# Patient Record
Sex: Female | Born: 1960 | Race: White | Hispanic: No | Marital: Married | Smoking: Never smoker
Health system: Southern US, Community
[De-identification: ages and names within clinical notes are randomized; demographics above are authoritative.]

## PROBLEM LIST (undated history)

## (undated) DIAGNOSIS — F419 Anxiety disorder, unspecified: Secondary | ICD-10-CM

## (undated) DIAGNOSIS — Z87898 Personal history of other specified conditions: Secondary | ICD-10-CM

## (undated) DIAGNOSIS — R1032 Left lower quadrant pain: Secondary | ICD-10-CM

## (undated) DIAGNOSIS — N840 Polyp of corpus uteri: Secondary | ICD-10-CM

## (undated) DIAGNOSIS — IMO0002 Reserved for concepts with insufficient information to code with codable children: Secondary | ICD-10-CM

## (undated) DIAGNOSIS — N289 Disorder of kidney and ureter, unspecified: Secondary | ICD-10-CM

## (undated) DIAGNOSIS — K649 Unspecified hemorrhoids: Secondary | ICD-10-CM

## (undated) DIAGNOSIS — R87619 Unspecified abnormal cytological findings in specimens from cervix uteri: Secondary | ICD-10-CM

## (undated) DIAGNOSIS — Z8619 Personal history of other infectious and parasitic diseases: Secondary | ICD-10-CM

## (undated) DIAGNOSIS — K219 Gastro-esophageal reflux disease without esophagitis: Secondary | ICD-10-CM

## (undated) DIAGNOSIS — D219 Benign neoplasm of connective and other soft tissue, unspecified: Secondary | ICD-10-CM

## (undated) DIAGNOSIS — I10 Essential (primary) hypertension: Secondary | ICD-10-CM

## (undated) DIAGNOSIS — N83209 Unspecified ovarian cyst, unspecified side: Secondary | ICD-10-CM

## (undated) DIAGNOSIS — K589 Irritable bowel syndrome without diarrhea: Secondary | ICD-10-CM

## (undated) HISTORY — PX: WISDOM TOOTH EXTRACTION: SHX21

## (undated) HISTORY — DX: Personal history of other specified conditions: Z87.898

## (undated) HISTORY — PX: TUBAL LIGATION: SHX77

## (undated) HISTORY — DX: Left lower quadrant pain: R10.32

## (undated) HISTORY — DX: Reserved for concepts with insufficient information to code with codable children: IMO0002

## (undated) HISTORY — DX: Essential (primary) hypertension: I10

## (undated) HISTORY — DX: Irritable bowel syndrome, unspecified: K58.9

## (undated) HISTORY — PX: INNER EAR SURGERY: SHX679

## (undated) HISTORY — PX: CHOLECYSTECTOMY: SHX55

## (undated) HISTORY — DX: Unspecified abnormal cytological findings in specimens from cervix uteri: R87.619

## (undated) HISTORY — DX: Anxiety disorder, unspecified: F41.9

## (undated) HISTORY — DX: Polyp of corpus uteri: N84.0

## (undated) HISTORY — DX: Disorder of kidney and ureter, unspecified: N28.9

## (undated) HISTORY — PX: CARDIOVASCULAR SURGERY: SHX460

## (undated) HISTORY — DX: Gastro-esophageal reflux disease without esophagitis: K21.9

## (undated) HISTORY — DX: Personal history of other infectious and parasitic diseases: Z86.19

## (undated) HISTORY — PX: TOE AMPUTATION: SHX809

## (undated) HISTORY — DX: Unspecified ovarian cyst, unspecified side: N83.209

## (undated) HISTORY — DX: Benign neoplasm of connective and other soft tissue, unspecified: D21.9

## (undated) HISTORY — DX: Unspecified hemorrhoids: K64.9

---

## 2000-02-13 ENCOUNTER — Ambulatory Visit (HOSPITAL_COMMUNITY): Admission: RE | Admit: 2000-02-13 | Discharge: 2000-02-13 | Payer: Self-pay

## 2000-03-13 ENCOUNTER — Other Ambulatory Visit: Admission: RE | Admit: 2000-03-13 | Discharge: 2000-03-13 | Payer: Self-pay | Admitting: *Deleted

## 2001-04-08 ENCOUNTER — Encounter: Admission: RE | Admit: 2001-04-08 | Discharge: 2001-04-08 | Payer: Self-pay

## 2004-06-15 ENCOUNTER — Encounter: Admission: RE | Admit: 2004-06-15 | Discharge: 2004-06-15 | Payer: Self-pay

## 2004-08-03 ENCOUNTER — Encounter: Admission: RE | Admit: 2004-08-03 | Discharge: 2004-10-10 | Payer: Self-pay | Admitting: Orthopedic Surgery

## 2005-01-22 DIAGNOSIS — N840 Polyp of corpus uteri: Secondary | ICD-10-CM

## 2005-01-22 HISTORY — DX: Polyp of corpus uteri: N84.0

## 2005-09-27 ENCOUNTER — Ambulatory Visit: Payer: Self-pay | Admitting: Gastroenterology

## 2005-10-01 ENCOUNTER — Ambulatory Visit: Payer: Self-pay | Admitting: Gastroenterology

## 2005-10-04 ENCOUNTER — Encounter: Admission: RE | Admit: 2005-10-04 | Discharge: 2005-10-04 | Payer: Self-pay | Admitting: *Deleted

## 2005-10-04 ENCOUNTER — Other Ambulatory Visit: Admission: RE | Admit: 2005-10-04 | Discharge: 2005-10-04 | Payer: Self-pay | Admitting: Obstetrics and Gynecology

## 2005-10-09 ENCOUNTER — Ambulatory Visit: Payer: Self-pay | Admitting: Gastroenterology

## 2005-11-12 ENCOUNTER — Ambulatory Visit: Payer: Self-pay | Admitting: Gastroenterology

## 2005-11-27 ENCOUNTER — Ambulatory Visit (HOSPITAL_COMMUNITY): Admission: RE | Admit: 2005-11-27 | Discharge: 2005-11-27 | Payer: Self-pay | Admitting: Obstetrics and Gynecology

## 2006-10-18 DIAGNOSIS — R1032 Left lower quadrant pain: Secondary | ICD-10-CM

## 2006-10-18 DIAGNOSIS — K649 Unspecified hemorrhoids: Secondary | ICD-10-CM

## 2006-10-18 HISTORY — DX: Left lower quadrant pain: R10.32

## 2006-10-18 HISTORY — DX: Unspecified hemorrhoids: K64.9

## 2008-01-23 DIAGNOSIS — N83209 Unspecified ovarian cyst, unspecified side: Secondary | ICD-10-CM

## 2008-01-23 HISTORY — DX: Unspecified ovarian cyst, unspecified side: N83.209

## 2011-06-20 ENCOUNTER — Telehealth: Payer: Self-pay | Admitting: Obstetrics and Gynecology

## 2011-06-20 NOTE — Telephone Encounter (Signed)
Jackie/cht received

## 2011-06-22 ENCOUNTER — Telehealth: Payer: Self-pay

## 2011-06-22 NOTE — Telephone Encounter (Signed)
LM for pt to call back to schedule appt for eval of vaginal discomfort. Andrea Huff A

## 2011-07-23 ENCOUNTER — Encounter: Payer: Self-pay | Admitting: Obstetrics and Gynecology

## 2011-07-23 ENCOUNTER — Ambulatory Visit (INDEPENDENT_AMBULATORY_CARE_PROVIDER_SITE_OTHER): Payer: Medicaid Other | Admitting: Obstetrics and Gynecology

## 2011-07-23 VITALS — BP 132/80 | Resp 16 | Ht 70.0 in | Wt 191.0 lb

## 2011-07-23 DIAGNOSIS — N949 Unspecified condition associated with female genital organs and menstrual cycle: Secondary | ICD-10-CM

## 2011-07-23 DIAGNOSIS — R102 Pelvic and perineal pain: Secondary | ICD-10-CM

## 2011-07-23 DIAGNOSIS — Z0000 Encounter for general adult medical examination without abnormal findings: Secondary | ICD-10-CM

## 2011-07-23 DIAGNOSIS — Z01419 Encounter for gynecological examination (general) (routine) without abnormal findings: Secondary | ICD-10-CM

## 2011-07-23 DIAGNOSIS — Z124 Encounter for screening for malignant neoplasm of cervix: Secondary | ICD-10-CM

## 2011-07-23 NOTE — Progress Notes (Signed)
Contraception BTL and s/p Ablation Last pap 2010 wnl Last Mammo: unknown many years ago Last Colonoscopy 2007 Last Dexa Scan ? If ever Primary MD Dr. Chestine Spore Abuse at Home None  Recently dx'd with neuropathy.  T2DM isdiet controlled.  Also c/o menopausal sxs.  Filed Vitals:   07/23/11 1513  BP: 132/80  Resp: 16   ROS: noncontributory  Physical Examination: General appearance - alert, well appearing, and in no distress Neck - supple, no significant adenopathy Chest - clear to auscultation, no wheezes, rales or rhonchi, symmetric air entry Heart - normal rate and regular rhythm Abdomen - soft, nontender, nondistended, no masses or organomegaly Breasts - breasts appear normal, no suspicious masses, no skin or nipple changes or axillary nodes Pelvic - normal external genitalia, vulva, vagina, cervix, uterus and adnexa Back exam - no CVAT Extremities - no edema, redness or tenderness in the calves or thighs  A/P Pap today Sched u/s secondary to rt pelvic pain Sched mammo

## 2011-07-24 LAB — PAP IG W/ RFLX HPV ASCU

## 2011-08-13 ENCOUNTER — Ambulatory Visit (INDEPENDENT_AMBULATORY_CARE_PROVIDER_SITE_OTHER): Payer: Medicaid Other | Admitting: Obstetrics and Gynecology

## 2011-08-13 ENCOUNTER — Encounter: Payer: Self-pay | Admitting: Obstetrics and Gynecology

## 2011-08-13 ENCOUNTER — Ambulatory Visit (INDEPENDENT_AMBULATORY_CARE_PROVIDER_SITE_OTHER): Payer: Medicaid Other

## 2011-08-13 ENCOUNTER — Other Ambulatory Visit: Payer: Self-pay | Admitting: Obstetrics and Gynecology

## 2011-08-13 VITALS — BP 140/90 | Resp 14 | Ht 70.0 in | Wt 193.0 lb

## 2011-08-13 DIAGNOSIS — K5900 Constipation, unspecified: Secondary | ICD-10-CM

## 2011-08-13 DIAGNOSIS — R1031 Right lower quadrant pain: Secondary | ICD-10-CM

## 2011-08-13 DIAGNOSIS — N949 Unspecified condition associated with female genital organs and menstrual cycle: Secondary | ICD-10-CM

## 2011-08-13 DIAGNOSIS — R102 Pelvic and perineal pain: Secondary | ICD-10-CM

## 2011-08-13 NOTE — Progress Notes (Signed)
Ultrasound today uterus 7.1 x 4.0 x 3.7 cm and normal bilateral ovaries and normal endometrium Pt says she still has the pain (RLQ). She also says that her BMs are not regular and she is undergoing w/u with GI  I rec pt rto sooner if sxs persist once BMs are regular Pt is agreeable

## 2012-10-02 ENCOUNTER — Ambulatory Visit: Payer: Medicaid Other | Attending: Unknown Physician Specialty | Admitting: Physical Therapy

## 2012-10-02 DIAGNOSIS — M542 Cervicalgia: Secondary | ICD-10-CM | POA: Insufficient documentation

## 2012-10-02 DIAGNOSIS — IMO0001 Reserved for inherently not codable concepts without codable children: Secondary | ICD-10-CM | POA: Insufficient documentation

## 2012-10-02 DIAGNOSIS — M546 Pain in thoracic spine: Secondary | ICD-10-CM | POA: Insufficient documentation

## 2013-11-23 ENCOUNTER — Encounter: Payer: Self-pay | Admitting: Obstetrics and Gynecology

## 2017-06-09 ENCOUNTER — Emergency Department (HOSPITAL_BASED_OUTPATIENT_CLINIC_OR_DEPARTMENT_OTHER)
Admission: EM | Admit: 2017-06-09 | Discharge: 2017-06-09 | Disposition: A | Discharge status: Home / Self Care | Payer: Medicaid Other | Attending: Emergency Medicine | Admitting: Emergency Medicine

## 2017-06-09 ENCOUNTER — Other Ambulatory Visit: Payer: Self-pay

## 2017-06-09 ENCOUNTER — Encounter (HOSPITAL_BASED_OUTPATIENT_CLINIC_OR_DEPARTMENT_OTHER): Payer: Self-pay | Admitting: Emergency Medicine

## 2017-06-09 DIAGNOSIS — I10 Essential (primary) hypertension: Secondary | ICD-10-CM | POA: Diagnosis not present

## 2017-06-09 DIAGNOSIS — W269XXA Contact with unspecified sharp object(s), initial encounter: Secondary | ICD-10-CM | POA: Insufficient documentation

## 2017-06-09 DIAGNOSIS — Z89429 Acquired absence of other toe(s), unspecified side: Secondary | ICD-10-CM | POA: Insufficient documentation

## 2017-06-09 DIAGNOSIS — S61211A Laceration without foreign body of left index finger without damage to nail, initial encounter: Secondary | ICD-10-CM | POA: Insufficient documentation

## 2017-06-09 DIAGNOSIS — Z79899 Other long term (current) drug therapy: Secondary | ICD-10-CM | POA: Insufficient documentation

## 2017-06-09 DIAGNOSIS — Z23 Encounter for immunization: Secondary | ICD-10-CM | POA: Insufficient documentation

## 2017-06-09 DIAGNOSIS — Y999 Unspecified external cause status: Secondary | ICD-10-CM | POA: Insufficient documentation

## 2017-06-09 DIAGNOSIS — Y929 Unspecified place or not applicable: Secondary | ICD-10-CM | POA: Insufficient documentation

## 2017-06-09 DIAGNOSIS — Y93K3 Activity, grooming and shearing an animal: Secondary | ICD-10-CM | POA: Diagnosis not present

## 2017-06-09 MED ORDER — TETANUS-DIPHTH-ACELL PERTUSSIS 5-2.5-18.5 LF-MCG/0.5 IM SUSP
0.5000 mL | Freq: Once | INTRAMUSCULAR | Status: AC
  Administered 2017-06-09: 0.5 mL via INTRAMUSCULAR
  Filled 2017-06-09: qty 0.5

## 2017-06-09 NOTE — ED Provider Notes (Signed)
[place] [facility] DEPARTMENT Provider Note   CSN: [id number] Arrival date & time: 06/09/17  1816     History   Chief Complaint Chief Complaint  Patient presents with  . Finger Injury    and multiple other complaints    HPI Andrea Huff is a 57 y.o. female.  The history is provided by the patient. No language interpreter was used.  Hand Pain  This is a new problem. The current episode started less than 1 hour ago. The problem occurs constantly. The problem has been gradually worsening. Pertinent negatives include no shortness of breath. Nothing aggravates the symptoms. Nothing relieves the symptoms. She has tried nothing for the symptoms. The treatment provided no relief.   Pt reports she ws trimming her dogs fur and cut her finger.  Pt reports bleeding since lat pm.   Past Medical History:  Diagnosis Date  . Abnormal Pap smear    Age 28  . Anxiety   . Endometrial polyp 2007  . Fibroid   . GERD (gastroesophageal reflux disease)   . H/O varicella   . Hemorrhoids 10/18/06  . History of measles, mumps, or rubella   . Hypertension   . IBS (irritable bowel syndrome)   . Left lower quadrant pain 10/18/06  . Nephropathy   . Ovarian cyst 2010    There are no active problems to display for this patient.   Past Surgical History:  Procedure Laterality Date  . CARDIOVASCULAR SURGERY    . CHOLECYSTECTOMY    . INNER EAR SURGERY    . TOE AMPUTATION    . TUBAL LIGATION    . WISDOM TOOTH EXTRACTION       OB History    Gravida  4   Para  0   Term      Preterm      AB      Living        SAB      TAB      Ectopic      Multiple      Live Births               Home Medications    Prior to Admission medications   Medication Sig Start Date End Date Taking? Authorizing Provider  ALPRAZolam Duanne Moron) 0.5 MG tablet Take 0.5 mg by mouth at bedtime as needed.    [provider]  clonazePAM (KLONOPIN) 1 MG tablet Take 1 mg by mouth 3  (three) times daily.    [provider]  diclofenac (VOLTAREN) 75 MG EC tablet Take 75 mg by mouth 2 (two) times daily.    [provider]  DULoxetine (CYMBALTA) 60 MG capsule Take 60 mg by mouth daily.    [provider]  fluocinonide ointment (LIDEX) 0.05 % Apply topically 2 (two) times daily.    [provider]  fluticasone (FLOVENT HFA) 110 MCG/ACT inhaler Inhale 1 puff into the lungs 2 (two) times daily.    [provider]  hydroxychloroquine (PLAQUENIL) 200 MG tablet Take by mouth daily.    [provider]  lisinopril (PRINIVIL,ZESTRIL) 10 MG tablet Take 40 mg by mouth daily.    [provider]  metoprolol succinate (TOPROL-XL) 100 MG 24 hr tablet Take 100 mg by mouth daily. Take with or immediately following a meal.    [provider]  oxcarbazepine (TRILEPTAL) 600 MG tablet Take 600 mg by mouth 3 (three) times daily.    [provider]  pantoprazole (PROTONIX) 40 MG tablet Take 40 mg by mouth daily.    [provider]  phenytoin (DILANTIN) 100 MG ER capsule Take 100 mg by mouth 3 (three) times daily.    [provider]  traMADol (ULTRAM) 50 MG tablet Take 50 mg by mouth every 6 (six) hours as needed.    [provider]    Family History History reviewed. No pertinent family history.  Social History Social History   Tobacco Use  . Smoking status: Never Smoker  . Smokeless tobacco: Never Used  Substance Use Topics  . Alcohol use: No  . Drug use: No     Allergies   Asa [aspirin]; Codeine; Morphine and related; and Vicodin [hydrocodone-acetaminophen]   Review of Systems Review of Systems  Respiratory: Negative for shortness of breath.   All other systems reviewed and are negative.    Physical Exam Updated Vital Signs Pulse 80   Temp 98.5 F (36.9 C) (Oral)   Resp 18   Ht 5\' 10"  (1.778 m)   Wt 90.7 kg (200 lb)   SpO2 (!) 78%   BMI 28.70 kg/m   Physical  Exam  Constitutional: She appears well-developed and well-nourished.  Musculoskeletal: She exhibits tenderness.  74mm small flap laceration left index finger  No gappiing.  No bleeding.   Neurological: She is alert.  Skin: Skin is warm.  Psychiatric: She has a normal mood and affect.  Nursing note and vitals reviewed.    ED Treatments / Results  Labs (all labs ordered are listed, but only abnormal results are displayed) Labs Reviewed - No data to display  EKG None  Radiology No results found.  Procedures Procedures (including critical care time)  Medications Ordered in ED Medications  Tdap (BOOSTRIX) injection 0.5 mL (has no administration in time range)     Initial Impression / Assessment and Plan / ED Course  I have reviewed the triage vital signs and the nursing notes.  Pertinent labs & imaging results that were available during my care of the patient were reviewed by me and considered in my medical decision making (see chart for details).     Pt unsure of last tetanus.  Pt is on antibiotics for toe infection.  Pt has had multiple toes amputated.   No drainage from toe,no redness  Pt advised to see her MD for recheck of wound to finger and follow up for toe infection.   Final Clinical Impressions(s) / ED Diagnoses   Final diagnoses:  Laceration of left index finger without foreign body without damage to nail, initial encounter    ED Discharge Orders    None       Sidney Ace 06/09/17 Mena Goes, MD 06/11/17 2330

## 2017-06-09 NOTE — ED Notes (Signed)
While triaging the patient she states " while I am here I might as well have them check me out for this cold that I have been havin for the last 2 weeks, and also the sore in my nose" her family states that she should have the tunneling wound on her foot looked at as well even though the patient has a dr that will look at it

## 2017-06-09 NOTE — ED Triage Notes (Signed)
Patient was cutting her dogs hair and cut her left first finger with the scissors. The patient states that it will not stop bleeding - reports that she is unsure what time last night this happened

## 2017-06-09 NOTE — Discharge Instructions (Addendum)
Continue your antibiotics for foot infection.  See your Physician for recheck this week.

## 2017-06-09 NOTE — ED Notes (Signed)
Pt given d/c instructions as per chart. Verbalizes understanding. No questions. 

## 2018-01-14 ENCOUNTER — Encounter (HOSPITAL_BASED_OUTPATIENT_CLINIC_OR_DEPARTMENT_OTHER): Payer: Self-pay

## 2018-01-14 ENCOUNTER — Emergency Department (HOSPITAL_BASED_OUTPATIENT_CLINIC_OR_DEPARTMENT_OTHER): Payer: Medicaid Other

## 2018-01-14 ENCOUNTER — Emergency Department (HOSPITAL_BASED_OUTPATIENT_CLINIC_OR_DEPARTMENT_OTHER)
Admission: EM | Admit: 2018-01-14 | Discharge: 2018-01-14 | Disposition: A | Discharge status: Home / Self Care | Payer: Medicaid Other | Attending: Emergency Medicine | Admitting: Emergency Medicine

## 2018-01-14 ENCOUNTER — Other Ambulatory Visit: Payer: Self-pay

## 2018-01-14 DIAGNOSIS — W540XXA Bitten by dog, initial encounter: Secondary | ICD-10-CM | POA: Diagnosis not present

## 2018-01-14 DIAGNOSIS — Y929 Unspecified place or not applicable: Secondary | ICD-10-CM | POA: Diagnosis not present

## 2018-01-14 DIAGNOSIS — Y999 Unspecified external cause status: Secondary | ICD-10-CM | POA: Diagnosis not present

## 2018-01-14 DIAGNOSIS — Z79899 Other long term (current) drug therapy: Secondary | ICD-10-CM | POA: Insufficient documentation

## 2018-01-14 DIAGNOSIS — S61214A Laceration without foreign body of right ring finger without damage to nail, initial encounter: Secondary | ICD-10-CM | POA: Insufficient documentation

## 2018-01-14 DIAGNOSIS — I10 Essential (primary) hypertension: Secondary | ICD-10-CM | POA: Diagnosis not present

## 2018-01-14 DIAGNOSIS — Y93K9 Activity, other involving animal care: Secondary | ICD-10-CM | POA: Diagnosis not present

## 2018-01-14 MED ORDER — LIDOCAINE HCL (PF) 1 % IJ SOLN
30.0000 mL | Freq: Once | INTRAMUSCULAR | Status: AC
  Administered 2018-01-14: 30 mL
  Filled 2018-01-14: qty 30

## 2018-01-14 MED ORDER — AMOXICILLIN-POT CLAVULANATE 875-125 MG PO TABS
1.0000 | ORAL_TABLET | Freq: Once | ORAL | Status: AC
  Administered 2018-01-14: 1 via ORAL
  Filled 2018-01-14: qty 1

## 2018-01-14 MED ORDER — AMOXICILLIN-POT CLAVULANATE 875-125 MG PO TABS: 1.0000 | ORAL_TABLET | Freq: Two times a day (BID) | ORAL | 0 refills | Status: DC

## 2018-01-14 MED FILL — AMOX-CLAV 875-125 MG TABLET: 875-125 | 7 days supply | Qty: 14 | Fill #0

## 2018-01-14 NOTE — ED Triage Notes (Addendum)
Pt report cutting her right ring finger on a food processor this AM. Pt reports last tetanus was 2 years ago. Bleeding controlled. Pt denies anticoagulants.

## 2018-01-14 NOTE — Discharge Instructions (Addendum)
Treatment: Take Augmentin until completed.  Keep your wound dry and dressing applied until this time tomorrow. After 24 hours, you may wash with warm soapy water. Dry and apply antibiotic ointment and clean dressing. Do this daily until your sutures are removed.  Follow-up: Please follow-up with your primary care provider or return to emergency department in 7-10 days for suture removal. Be aware of signs of infection: fever, increasing pain, redness, swelling, drainage from the area. Please call your primary care provider or return to emergency department if you develop any of these symptoms or if any of the sutures come out prior to removal. Please return to the emergency department if you develop any other new or worsening symptoms.

## 2018-01-15 NOTE — ED Provider Notes (Signed)
[facility] Provider Note   CSN: [id number] Arrival date & time: 01/14/18  1026     History   Chief Complaint Chief Complaint  Patient presents with  . Extremity Laceration    Right Ring Finger    HPI Andrea Huff is a 57 y.o. female with history of hypertension who presents with laceration to right ring finger after trying to break up her dogs from fighting and getting her finger caught on her dog's tooth.  She reports the dog did not bite down on her, but more just grazed the finger.  This happened around 4:30 AM and has continued to bleed without pressure.  Patient's tetanus is up-to-date.  The dog's rabies is up-to-date.  Patient did wash the wound well with water and peroxide and applied bacitracin prior to arrival.  Patient denies any other injuries.  HPI  Past Medical History:  Diagnosis Date  . Abnormal Pap smear    Age 24  . Anxiety   . Endometrial polyp 2007  . Fibroid   . GERD (gastroesophageal reflux disease)   . H/O varicella   . Hemorrhoids 10/18/06  . History of measles, mumps, or rubella   . Hypertension   . IBS (irritable bowel syndrome)   . Left lower quadrant pain 10/18/06  . Nephropathy   . Ovarian cyst 2010    There are no active problems to display for this patient.   Past Surgical History:  Procedure Laterality Date  . CARDIOVASCULAR SURGERY    . CHOLECYSTECTOMY    . INNER EAR SURGERY    . TOE AMPUTATION    . TUBAL LIGATION    . WISDOM TOOTH EXTRACTION       OB History    Gravida  4   Para  0   Term      Preterm      AB      Living        SAB      TAB      Ectopic      Multiple      Live Births               Home Medications    Prior to Admission medications   Medication Sig Start Date End Date Taking? Authorizing Provider  ALPRAZolam Duanne Moron) 0.5 MG tablet Take 0.5 mg by mouth at bedtime as needed.    [provider]  amoxicillin-clavulanate (AUGMENTIN) 875-125 MG  tablet Take 1 tablet by mouth every 12 (twelve) hours. 01/14/18   Loy Mccartt, Bea Graff, PA-C  clonazePAM (KLONOPIN) 1 MG tablet Take 1 mg by mouth 3 (three) times daily.    [provider]  diclofenac (VOLTAREN) 75 MG EC tablet Take 75 mg by mouth 2 (two) times daily.    [provider]  DULoxetine (CYMBALTA) 60 MG capsule Take 60 mg by mouth daily.    [provider]  fluocinonide ointment (LIDEX) 0.05 % Apply topically 2 (two) times daily.    [provider]  fluticasone (FLOVENT HFA) 110 MCG/ACT inhaler Inhale 1 puff into the lungs 2 (two) times daily.    [provider]  hydroxychloroquine (PLAQUENIL) 200 MG tablet Take by mouth daily.    [provider]  lisinopril (PRINIVIL,ZESTRIL) 10 MG tablet Take 40 mg by mouth daily.    [provider]  metoprolol succinate (TOPROL-XL) 100 MG 24 hr tablet Take 100 mg by mouth daily. Take with or immediately following a meal.  [provider]  oxcarbazepine (TRILEPTAL) 600 MG tablet Take 600 mg by mouth 3 (three) times daily.    [provider]  pantoprazole (PROTONIX) 40 MG tablet Take 40 mg by mouth daily.    [provider]  phenytoin (DILANTIN) 100 MG ER capsule Take 100 mg by mouth 3 (three) times daily.    [provider]  traMADol (ULTRAM) 50 MG tablet Take 50 mg by mouth every 6 (six) hours as needed.    [provider]    Family History History reviewed. No pertinent family history.  Social History Social History   Tobacco Use  . Smoking status: Never Smoker  . Smokeless tobacco: Never Used  Substance Use Topics  . Alcohol use: No  . Drug use: No     Allergies   Asa [aspirin]; Codeine; Morphine and related; and Vicodin [hydrocodone-acetaminophen]   Review of Systems Review of Systems  Musculoskeletal: Positive for arthralgias.  Skin: Positive for wound.  Neurological: Negative for numbness.     Physical Exam Updated  Vital Signs BP (!) 147/84   Pulse 71   Temp 98.4 F (36.9 C) (Oral)   Resp 18   SpO2 98%   Physical Exam Vitals signs and nursing note reviewed.  Constitutional:      General: She is not in acute distress.    Appearance: She is well-developed. She is not diaphoretic.  HENT:     Head: Normocephalic and atraumatic.     Mouth/Throat:     Pharynx: No oropharyngeal exudate.  Eyes:     General: No scleral icterus.       Right eye: No discharge.        Left eye: No discharge.     Conjunctiva/sclera: Conjunctivae normal.     Pupils: Pupils are equal, round, and reactive to light.  Neck:     Musculoskeletal: Normal range of motion and neck supple.     Thyroid: No thyromegaly.  Cardiovascular:     Rate and Rhythm: Normal rate and regular rhythm.     Heart sounds: Normal heart sounds. No murmur. No friction rub. No gallop.   Pulmonary:     Effort: Pulmonary effort is normal. No respiratory distress.     Breath sounds: Normal breath sounds. No stridor. No wheezing or rales.  Abdominal:     General: Bowel sounds are normal. There is no distension.     Palpations: Abdomen is soft.     Tenderness: There is no abdominal tenderness. There is no guarding or rebound.  Musculoskeletal:     Comments: L-shaped laceration to the palmar aspect distal to the DIP of the right ring finger, mild active bleeding, sensation intact, cap refill less than 2 seconds, full range of motion of the digit Very superficial laceration on the dorsal aspect proximal to the nail without nail involvement  Lymphadenopathy:     Cervical: No cervical adenopathy.  Skin:    General: Skin is warm and dry.     Coloration: Skin is not pale.     Findings: No rash.  Neurological:     Mental Status: She is alert.     Coordination: Coordination normal.      ED Treatments / Results  Labs (all labs ordered are listed, but only abnormal results are displayed) Labs Reviewed - No data to  display  EKG None  Radiology Dg Finger Ring Right  Result Date: 01/14/2018 CLINICAL DATA:  RIGHT ring finger pain, tenderness with laceration. Dog injury. EXAM: RIGHT  RING FINGER 2+V COMPARISON:  None. FINDINGS: There is no evidence of fracture or dislocation. There is no evidence of arthropathy or other focal bone abnormality. There may be mild soft tissue swelling. No radiopaque foreign body. IMPRESSION: 1. Negative for fracture. 2. No radiopaque foreign body. Electronically Signed   By: Staci Righter M.D.   On: 01/14/2018 12:46    Procedures .Marland KitchenLaceration Repair Date/Time: 01/15/2018 11:13 AM Performed by: Frederica Kuster, PA-C Authorized by: Frederica Kuster, PA-C   Consent:    Consent obtained:  Verbal   Consent given by:  Patient   Risks discussed:  Infection, pain and poor cosmetic result   Alternatives discussed:  No treatment Anesthesia (see MAR for exact dosages):    Anesthesia method:  Nerve block   Block location:  Digital block   Block needle gauge:  25 G   Block anesthetic:  Lidocaine 1% w/o epi   Block technique:  Transthecal   Block injection procedure:  Anatomic landmarks identified, introduced needle, incremental injection, anatomic landmarks palpated and negative aspiration for blood   Block outcome:  Anesthesia achieved Laceration details:    Location:  Finger   Finger location:  R ring finger   Length (cm):  2 Repair type:    Repair type:  Simple Pre-procedure details:    Preparation:  Patient was prepped and draped in usual sterile fashion and imaging obtained to evaluate for foreign bodies Exploration:    Hemostasis achieved with:  Direct pressure and tourniquet   Wound exploration: wound explored through full range of motion and entire depth of wound probed and visualized     Wound extent: no foreign bodies/material noted and no muscle damage noted     Contaminated: no   Treatment:    Area cleansed with:  Saline   Amount of cleaning:  Standard    Irrigation solution:  Sterile saline   Irrigation volume:  141mL   Irrigation method:  Syringe   Visualized foreign bodies/material removed: no   Skin repair:    Repair method:  Sutures   Suture size:  5-0   Wound skin closure material used: Ethilon.   Suture technique:  Simple interrupted   Number of sutures:  4 Approximation:    Approximation:  Close Post-procedure details:    Dressing:  Antibiotic ointment and bulky dressing   Patient tolerance of procedure:  Tolerated well, no immediate complications   (including critical care time)  Medications Ordered in ED Medications  amoxicillin-clavulanate (AUGMENTIN) 875-125 MG per tablet 1 tablet (1 tablet Oral Given 01/14/18 1328)  lidocaine (PF) (XYLOCAINE) 1 % injection 30 mL (30 mLs Infiltration Given by Other 01/14/18 1329)     Initial Impression / Assessment and Plan / ED Course  I have reviewed the triage vital signs and the nursing notes.  Pertinent labs & imaging results that were available during my care of the patient were reviewed by me and considered in my medical decision making (see chart for details).     Patient presenting with laceration from dog tooth, essentially dog bite.  Will cover with Augmentin.  Tetanus UTD. Laceration occurred < 12 hours prior to repair.  Laceration loosely repaired allowing for drainage considering dog bite.  Discussed laceration care with pt and answered questions. Pt to f-u for suture removal in 7 days days and wound check sooner should there be signs of dehiscence or infection. Pt is hemodynamically stable with no complaints prior to dc.  Patient understands and agrees with plan.  Patient vitals stable throughout ED course and discharged in satisfactory condition.   Final Clinical Impressions(s) / ED Diagnoses   Final diagnoses:  Laceration of right ring finger, foreign body presence unspecified, nail damage status unspecified, initial encounter  Dog bite, initial encounter    ED  Discharge Orders         Ordered    amoxicillin-clavulanate (AUGMENTIN) 875-125 MG tablet  Every 12 hours     01/14/18 [address] [place], PA-C 01/15/18 1115    Orlie Dakin, MD 01/15/18 1402

## 2020-11-09 ENCOUNTER — Emergency Department (HOSPITAL_BASED_OUTPATIENT_CLINIC_OR_DEPARTMENT_OTHER)
Admission: EM | Admit: 2020-11-09 | Discharge: 2020-11-09 | Disposition: A | Discharge status: Home / Self Care | Payer: Medicaid Other | Attending: Emergency Medicine | Admitting: Emergency Medicine

## 2020-11-09 ENCOUNTER — Emergency Department (HOSPITAL_BASED_OUTPATIENT_CLINIC_OR_DEPARTMENT_OTHER): Payer: Medicaid Other

## 2020-11-09 ENCOUNTER — Other Ambulatory Visit: Payer: Self-pay

## 2020-11-09 DIAGNOSIS — Z79899 Other long term (current) drug therapy: Secondary | ICD-10-CM | POA: Insufficient documentation

## 2020-11-09 DIAGNOSIS — I10 Essential (primary) hypertension: Secondary | ICD-10-CM | POA: Insufficient documentation

## 2020-11-09 DIAGNOSIS — L97511 Non-pressure chronic ulcer of other part of right foot limited to breakdown of skin: Secondary | ICD-10-CM | POA: Insufficient documentation

## 2020-11-09 LAB — CBC WITH DIFFERENTIAL/PLATELET
Abs Immature Granulocytes: 0.04 10*3/uL (ref 0.00–0.07)
Basophils Absolute: 0 10*3/uL (ref 0.0–0.1)
Basophils Relative: 1 %
Eosinophils Absolute: 0.2 10*3/uL (ref 0.0–0.5)
Eosinophils Relative: 2 %
HCT: 41.6 % (ref 36.0–46.0)
Hemoglobin: 14.5 g/dL (ref 12.0–15.0)
Immature Granulocytes: 1 %
Lymphocytes Relative: 28 %
Lymphs Abs: 2.2 10*3/uL (ref 0.7–4.0)
MCH: 29.5 pg (ref 26.0–34.0)
MCHC: 34.9 g/dL (ref 30.0–36.0)
MCV: 84.7 fL (ref 80.0–100.0)
Monocytes Absolute: 0.5 10*3/uL (ref 0.1–1.0)
Monocytes Relative: 6 %
Neutro Abs: 5 10*3/uL (ref 1.7–7.7)
Neutrophils Relative %: 62 %
Platelets: 185 10*3/uL (ref 150–400)
RBC: 4.91 MIL/uL (ref 3.87–5.11)
RDW: 12.2 % (ref 11.5–15.5)
WBC: 7.9 10*3/uL (ref 4.0–10.5)
nRBC: 0 % (ref 0.0–0.2)

## 2020-11-09 LAB — COMPREHENSIVE METABOLIC PANEL
ALT: 14 U/L (ref 0–44)
AST: 17 U/L (ref 15–41)
Albumin: 4.3 g/dL (ref 3.5–5.0)
Alkaline Phosphatase: 84 U/L (ref 38–126)
Anion gap: 9 (ref 5–15)
BUN: 13 mg/dL (ref 6–20)
CO2: 26 mmol/L (ref 22–32)
Calcium: 9.4 mg/dL (ref 8.9–10.3)
Chloride: 103 mmol/L (ref 98–111)
Creatinine, Ser: 0.7 mg/dL (ref 0.44–1.00)
GFR, Estimated: >60 mL/min (ref >60)
Glucose, Bld: 143 mg/dL — ABNORMAL HIGH (ref 70–99)
Potassium: 4.1 mmol/L (ref 3.5–5.1)
Sodium: 138 mmol/L (ref 135–145)
Total Bilirubin: 0.4 mg/dL (ref 0.3–1.2)
Total Protein: 7.6 g/dL (ref 6.5–8.1)

## 2020-11-09 LAB — LACTIC ACID, PLASMA: Lactic Acid, Venous: 1.2 mmol/L (ref 0.5–1.9)

## 2020-11-09 LAB — CBG MONITORING, ED: Glucose-Capillary: 147 mg/dL — ABNORMAL HIGH (ref 70–99)

## 2020-11-09 MED ORDER — AMOXICILLIN-POT CLAVULANATE 875-125 MG PO TABS
1.0000 | ORAL_TABLET | Freq: Once | ORAL | Status: AC
  Administered 2020-11-09: 1 via ORAL
  Filled 2020-11-09: qty 1

## 2020-11-09 MED ORDER — SULFAMETHOXAZOLE-TRIMETHOPRIM 800-160 MG PO TABS: 1.0000 | ORAL_TABLET | Freq: Two times a day (BID) | ORAL | 0 refills | Status: AC

## 2020-11-09 MED ORDER — AMOXICILLIN-POT CLAVULANATE 875-125 MG PO TABS: 1.0000 | ORAL_TABLET | Freq: Two times a day (BID) | ORAL | 0 refills | Status: AC

## 2020-11-09 MED ORDER — SULFAMETHOXAZOLE-TRIMETHOPRIM 800-160 MG PO TABS
1.0000 | ORAL_TABLET | Freq: Once | ORAL | Status: AC
  Administered 2020-11-09: 1 via ORAL
  Filled 2020-11-09: qty 1

## 2020-11-09 NOTE — Discharge Instructions (Signed)
Please return for rapid spreading or if you develop a fever.  Follow up with the wound care center.

## 2020-11-09 NOTE — ED Notes (Addendum)
Save Red,Green, Blue tubes drawn. PT aware of UA

## 2020-11-09 NOTE — ED Notes (Signed)
Prior to discharge, the patient's foot was rewrapped with a non-adherent dressing, gauze, and an ace wrap.

## 2020-11-09 NOTE — ED Provider Notes (Signed)
[facility] EMERGENCY DEPARTMENT Provider Note   CSN: [id number] Arrival date & time: 11/09/20  1005     History Chief Complaint  Patient presents with   Wound Infection    Andrea Huff is a 60 y.o. female.  60 yo F with a chief complaints of Left great toe ulceration.  The patient went to her pain management clinic today and they were concerned about the wound today and sent her here for evaluation.  She had tried to call the wound care center but unfortunately unable to see her until Tuesday.  No fevers or chills.  She usually is wheelchair-bound but splint for a bit of a outing on Saturday and when she was up and moving around she noticed that her sock was a bit wet and then realized that she had developed some ulcers to her great toe.  She denies any overt trauma.  Since then she has been trimming back the blistered area and that has expanded over the past 48 hours.  The history is provided by the patient.  Illness Severity:  Moderate Onset quality:  Gradual Duration:  2 days Timing:  Constant Progression:  Worsening Chronicity:  New Associated symptoms: no chest pain, no congestion, no fever, no headaches, no myalgias, no nausea, no rhinorrhea, no shortness of breath, no vomiting and no wheezing       Past Medical History:  Diagnosis Date   Abnormal Pap smear    Age 87   Anxiety    Endometrial polyp 2007   Fibroid    GERD (gastroesophageal reflux disease)    H/O varicella    Hemorrhoids 10/18/06   History of measles, mumps, or rubella    Hypertension    IBS (irritable bowel syndrome)    Left lower quadrant pain 10/18/06   Nephropathy    Ovarian cyst 2010    There are no problems to display for this patient.   Past Surgical History:  Procedure Laterality Date   CARDIOVASCULAR SURGERY     CHOLECYSTECTOMY     INNER EAR SURGERY     TOE AMPUTATION     TUBAL LIGATION     WISDOM TOOTH EXTRACTION       OB History     Gravida  4   Para  0    Term      Preterm      AB      Living         SAB      IAB      Ectopic      Multiple      Live Births              No family history on file.  Social History   Tobacco Use   Smoking status: Never   Smokeless tobacco: Never  Substance Use Topics   Alcohol use: No   Drug use: No    Home Medications Prior to Admission medications   Medication Sig Start Date End Date Taking? Authorizing Provider  amoxicillin-clavulanate (AUGMENTIN) 875-125 MG tablet Take 1 tablet by mouth every 12 (twelve) hours. 11/09/20  Yes Deno Etienne, DO  sulfamethoxazole-trimethoprim (BACTRIM DS) 800-160 MG tablet Take 1 tablet by mouth 2 (two) times daily for 7 days. 11/09/20 11/16/20 Yes Deno Etienne, DO  ALPRAZolam Duanne Moron) 0.5 MG tablet Take 0.5 mg by mouth at bedtime as needed.    [provider]  clonazePAM (KLONOPIN) 1 MG tablet Take 1 mg by mouth 3 (three) times  daily.    [provider]  diclofenac (VOLTAREN) 75 MG EC tablet Take 75 mg by mouth 2 (two) times daily.    [provider]  DULoxetine (CYMBALTA) 60 MG capsule Take 60 mg by mouth daily.    [provider]  fluocinonide ointment (LIDEX) 0.05 % Apply topically 2 (two) times daily.    [provider]  fluticasone (FLOVENT HFA) 110 MCG/ACT inhaler Inhale 1 puff into the lungs 2 (two) times daily.    [provider]  hydroxychloroquine (PLAQUENIL) 200 MG tablet Take by mouth daily.    [provider]  lisinopril (PRINIVIL,ZESTRIL) 10 MG tablet Take 40 mg by mouth daily.    [provider]  metoprolol succinate (TOPROL-XL) 100 MG 24 hr tablet Take 100 mg by mouth daily. Take with or immediately following a meal.    [provider]  oxcarbazepine (TRILEPTAL) 600 MG tablet Take 600 mg by mouth 3 (three) times daily.    [provider]  pantoprazole (PROTONIX) 40 MG tablet Take 40 mg by mouth daily.    [provider]  phenytoin (DILANTIN)  100 MG ER capsule Take 100 mg by mouth 3 (three) times daily.    [provider]  traMADol (ULTRAM) 50 MG tablet Take 50 mg by mouth every 6 (six) hours as needed.    [provider]    Allergies    Asa [aspirin], Codeine, Morphine and related, and Vicodin [hydrocodone-acetaminophen]  Review of Systems   Review of Systems  Constitutional:  Negative for chills and fever.  HENT:  Negative for congestion and rhinorrhea.   Eyes:  Negative for redness and visual disturbance.  Respiratory:  Negative for shortness of breath and wheezing.   Cardiovascular:  Negative for chest pain and palpitations.  Gastrointestinal:  Negative for nausea and vomiting.  Genitourinary:  Negative for dysuria and urgency.  Musculoskeletal:  Negative for arthralgias and myalgias.  Skin:  Positive for wound. Negative for pallor.  Neurological:  Negative for dizziness and headaches.   Physical Exam Updated Vital Signs BP (!) 153/72   Pulse 62   Temp 98.2 F (36.8 C) (Oral)   Resp 18   Ht 5\' 9"  (1.753 m)   Wt 96.6 kg   SpO2 93%   BMI 31.45 kg/m   Physical Exam Vitals and nursing note reviewed.  Constitutional:      General: She is not in acute distress.    Appearance: She is well-developed. She is not diaphoretic.  HENT:     Head: Normocephalic and atraumatic.  Eyes:     Pupils: Pupils are equal, round, and reactive to light.  Cardiovascular:     Rate and Rhythm: Normal rate and regular rhythm.     Heart sounds: No murmur heard.   No friction rub. No gallop.  Pulmonary:     Effort: Pulmonary effort is normal.     Breath sounds: No wheezing or rales.  Abdominal:     General: There is no distension.     Palpations: Abdomen is soft.     Tenderness: There is no abdominal tenderness.  Musculoskeletal:        General: No tenderness.     Cervical back: Normal range of motion and neck supple.     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  Patient has an ulceration mostly  on the lateral aspect of the great toe with some extension around the nailbed.  The area has been trimmed, there is no obvious  purulent drainage no fluctuance or induration.  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  Neurological:     Mental Status: She is alert and oriented to person, place, and time.  Psychiatric:        Behavior: Behavior normal.    ED Results / Procedures / Treatments   Labs (all labs ordered are listed, but only abnormal results are displayed) Labs Reviewed  COMPREHENSIVE METABOLIC PANEL - Abnormal; Notable for the following components:      Result Value   Glucose, Bld 143 (*)    All other components within normal limits  CBG MONITORING, ED - Abnormal; Notable for the following components:   Glucose-Capillary 147 (*)    All other components within normal limits  LACTIC ACID, PLASMA  CBC WITH DIFFERENTIAL/PLATELET  LACTIC ACID, PLASMA  URINALYSIS, ROUTINE W REFLEX MICROSCOPIC    EKG None  Radiology DG Foot Complete Left  Result Date: 11/09/2020 CLINICAL DATA:  diabetic wound infection of the great toe. EXAM: LEFT FOOT - COMPLETE 3+ VIEW COMPARISON:  12/20/2016. FINDINGS: Previous amputation of the second toe at the metacarpal phalangeal joint. Hallux valgus deformity of the great toe. Soft tissue deformity of the great toe. No plain radiographic sign of osteomyelitis. 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 Hallux valgus deformity of the great toe. Soft tissue deformity of the great toe. No plain radiographic sign of osteomyelitis. Ordinary midfoot degenerative changes are noted. Electronically Signed   By: Nelson Chimes M.D.   On: 11/09/2020 11:12    Procedures Procedures   Medications Ordered in ED Medications  sulfamethoxazole-trimethoprim (BACTRIM DS) 800-160 MG per tablet 1 tablet (has no administration  in time range)  amoxicillin-clavulanate (AUGMENTIN) 875-125 MG per tablet 1 tablet (has no administration in time range)    ED Course  I have reviewed the triage vital signs and the nursing notes.  Pertinent labs & imaging results that were available during my care of the patient were reviewed by me and considered in my medical decision making (see chart for details).    MDM Rules/Calculators/A&P                           60 yo F with a chief complaints of a wound to her great toe of her left foot.  This started a couple days ago when she went out on an errand wearing her diabetic shoes.  She since then has been trimming away the dead skin and has trimmed away a fair amount of skin now along the toe.  Not obviously infected on my exam.  Plain film viewed by me without any subcutaneous emphysema or signs of osteo-.  She was sent here by her pain management clinic for evaluation.  I will start her on antibiotics.  Have her follow-up with the wound care center and her prior orthopedist or podiatrist.  11:36 AM:  I have discussed the diagnosis/risks/treatment options with the patient and believe the pt to be eligible for discharge home to follow-up with PCP, wound. We also discussed returning to the ED immediately if new or worsening sx occur. We discussed the sx which are most concerning (e.g., sudden worsening pain, fever, inability to tolerate by mouth, redness, drainage, rapid spreading) that necessitate immediate return. Medications administered to the patient during their visit and any new  prescriptions provided to the patient are listed below.  Medications given during this visit Medications  sulfamethoxazole-trimethoprim (BACTRIM DS) 800-160 MG per tablet 1 tablet (has no administration in time range)  amoxicillin-clavulanate (AUGMENTIN) 875-125 MG per tablet 1 tablet (has no administration in time range)     The patient appears reasonably screen and/or stabilized for discharge and I  doubt any other medical condition or other [facility] requiring further screening, evaluation, or treatment in the ED at this time prior to discharge.   Final Clinical Impression(s) / ED Diagnoses Final diagnoses:  Ulcer of right foot, limited to breakdown of skin ([place])    Rx / DC Orders ED Discharge Orders          Ordered    AMB referral to wound care center        11/09/20 1130    amoxicillin-clavulanate (AUGMENTIN) 875-125 MG tablet  Every 12 hours        11/09/20 1132    sulfamethoxazole-trimethoprim (BACTRIM DS) 800-160 MG tablet  2 times daily        11/09/20 Goodwin, Aslan Himes, DO 11/09/20 1136

## 2020-11-09 NOTE — ED Triage Notes (Signed)
Pt went to pain management doctor today, was told by wound care to come here. Wound to bottom of left great toe. Pt has diabetic neuropathy

## 2021-03-26 ENCOUNTER — Emergency Department (HOSPITAL_BASED_OUTPATIENT_CLINIC_OR_DEPARTMENT_OTHER)
Admission: EM | Admit: 2021-03-26 | Discharge: 2021-03-26 | Disposition: A | Discharge status: Home / Self Care | Payer: Medicaid Other | Attending: Emergency Medicine | Admitting: Emergency Medicine

## 2021-03-26 ENCOUNTER — Emergency Department (HOSPITAL_BASED_OUTPATIENT_CLINIC_OR_DEPARTMENT_OTHER): Payer: Medicaid Other

## 2021-03-26 ENCOUNTER — Encounter (HOSPITAL_BASED_OUTPATIENT_CLINIC_OR_DEPARTMENT_OTHER): Payer: Self-pay | Admitting: Emergency Medicine

## 2021-03-26 ENCOUNTER — Other Ambulatory Visit: Payer: Self-pay

## 2021-03-26 DIAGNOSIS — I10 Essential (primary) hypertension: Secondary | ICD-10-CM | POA: Insufficient documentation

## 2021-03-26 DIAGNOSIS — M25532 Pain in left wrist: Secondary | ICD-10-CM | POA: Diagnosis not present

## 2021-03-26 DIAGNOSIS — Z79899 Other long term (current) drug therapy: Secondary | ICD-10-CM | POA: Insufficient documentation

## 2021-03-26 MED ORDER — NAPROXEN 500 MG PO TABS: 500.0000 mg | ORAL_TABLET | Freq: Two times a day (BID) | ORAL | 0 refills | Status: AC

## 2021-03-26 NOTE — ED Provider Notes (Signed)
?[facility] EMERGENCY DEPARTMENT ?Provider Note ? ? ?CSN: [id number] ?Arrival date & time: 03/26/21  1158 ? ?  ? ?History ?Past medical history of hypertension  ?Chief Complaint  ?Patient presents with  ? Wrist Pain  ? ? ?Andrea Huff is a 61 y.o. female. ?She presents with a chief complaint of left wrist pain.  He said she says the pain has been present for about a month.  She does not remember any inciting injury but remembers waking up with pain to the medial aspect on the radial side of her wrist.  She said today she felt like the area was swollen a bit so that is why she came here.  She says it is worse when she is typing or plays on her phone.  She endorses a history of using her phone a lot to play games.  She says it also hurts when she moves her thumb.  Denies any numbness or tingling. ? ? ?Wrist Pain ? ? ?  ? ?Home Medications ?Prior to Admission medications   ?Medication Sig Start Date End Date Taking? Authorizing Provider  ?naproxen (NAPROSYN) 500 MG tablet Take 1 tablet (500 mg total) by mouth 2 (two) times daily for 7 days. 03/26/21 04/02/21 Yes Roney Youtz, Adora Fridge, PA-C  ?ALPRAZolam (XANAX) 0.5 MG tablet Take 0.5 mg by mouth at bedtime as needed.    [provider]  ?amoxicillin-clavulanate (AUGMENTIN) 875-125 MG tablet Take 1 tablet by mouth every 12 (twelve) hours. 11/09/20   Deno Etienne, DO  ?clonazePAM (KLONOPIN) 1 MG tablet Take 1 mg by mouth 3 (three) times daily.    [provider]  ?diclofenac (VOLTAREN) 75 MG EC tablet Take 75 mg by mouth 2 (two) times daily.    [provider]  ?DULoxetine (CYMBALTA) 60 MG capsule Take 60 mg by mouth daily.    [provider]  ?fluocinonide ointment (LIDEX) 0.05 % Apply topically 2 (two) times daily.    [provider]  ?fluticasone (FLOVENT HFA) 110 MCG/ACT inhaler Inhale 1 puff into the lungs 2 (two) times daily.    [provider]  ?hydroxychloroquine (PLAQUENIL) 200 MG tablet Take by mouth  daily.    [provider]  ?lisinopril (PRINIVIL,ZESTRIL) 10 MG tablet Take 40 mg by mouth daily.    [provider]  ?metoprolol succinate (TOPROL-XL) 100 MG 24 hr tablet Take 100 mg by mouth daily. Take with or immediately following a meal.    [provider]  ?oxcarbazepine (TRILEPTAL) 600 MG tablet Take 600 mg by mouth 3 (three) times daily.    [provider]  ?pantoprazole (PROTONIX) 40 MG tablet Take 40 mg by mouth daily.    [provider]  ?phenytoin (DILANTIN) 100 MG ER capsule Take 100 mg by mouth 3 (three) times daily.    [provider]  ?traMADol (ULTRAM) 50 MG tablet Take 50 mg by mouth every 6 (six) hours as needed.    [provider]  ?   ? ?Allergies    ?Asa [aspirin], Codeine, Morphine and related, and Vicodin [hydrocodone-acetaminophen]   ? ?Review of Systems   ?Review of Systems  ?Musculoskeletal:  Positive for arthralgias.  ?All other systems reviewed and are negative. ? ?Physical Exam ?Updated Vital Signs ?BP (!) 149/85 (BP Location: Left Arm)   Pulse 61   Temp 98.3 ?F (36.8 ?C) (Oral)   Resp 16   Ht '5\' 9"'$  (1.753 m)   Wt 96.6 kg   SpO2 96%  BMI 31.45 kg/m?  ?Physical Exam ?Vitals and nursing note reviewed.  ?Constitutional:   ?   General: She is not in acute distress. ?   Appearance: Normal appearance. She is well-developed. She is not ill-appearing, toxic-appearing or diaphoretic.  ?HENT:  ?   Head: Normocephalic and atraumatic.  ?   Nose: No nasal deformity.  ?   Mouth/Throat:  ?   Lips: Pink. No lesions.  ?Eyes:  ?   General: Gaze aligned appropriately. No scleral icterus.    ?   Right eye: No discharge.     ?   Left eye: No discharge.  ?   Conjunctiva/sclera: Conjunctivae normal.  ?   Right eye: Right conjunctiva is not injected. No exudate or hemorrhage. ?   Left eye: Left conjunctiva is not injected. No exudate or hemorrhage. ?Pulmonary:  ?   Effort: Pulmonary effort is normal. No respiratory distress.   ?Musculoskeletal:  ?   Comments: There is mild swelling to the left radial side of her wrist is slightly tender to the touch.  She is able to move her thumb but it causes pain with movement.  There is no obvious deformity.  No bruising.  She is able to move her wrist in all directions.  Radial pulse 2+.  Normal sensation.  ?Skin: ?   General: Skin is warm and dry.  ?Neurological:  ?   Mental Status: She is alert and oriented to person, place, and time.  ?Psychiatric:     ?   Mood and Affect: Mood normal.     ?   Speech: Speech normal.     ?   Behavior: Behavior normal. Behavior is cooperative.  ? ? ?ED Results / Procedures / Treatments   ?Labs ?(all labs ordered are listed, but only abnormal results are displayed) ?Labs Reviewed - No data to display ? ?EKG ?None ? ?Radiology ?DG Wrist Complete Right ? ?Result Date: 03/26/2021 ?CLINICAL DATA:  Pain and tenderness EXAM: RIGHT WRIST - COMPLETE 3+ VIEW COMPARISON:  None. FINDINGS: There is no evidence of fracture or dislocation. There is no evidence of arthropathy or other focal bone abnormality. Soft tissues are unremarkable. IMPRESSION: Negative. Electronically Signed   By: Kerby Moors M.D.   On: 03/26/2021 12:34   ? ?Procedures ?Procedures  ? ?Medications Ordered in ED ?Medications - No data to display ? ?ED Course/ Medical Decision Making/ A&P ?  ?                        ?Medical Decision Making ?Amount and/or Complexity of Data Reviewed ?Radiology: ordered. ? ?Risk ?Prescription drug management. ? ? ? ?MDM  ?This is a 61 y.o. female who presents to the ED with left wrist pain.  Pain has been ongoing for about a month now.  It is in the radial aspect of her left wrist.  She has full range of motion of all fingers, and wrist.  There is no overlying bruising or deformity noted.  We obtained an x-ray which was negative for any fractures. ?I suspect that this is tendinitis as patient has a history of frequently using her phone and playing games on it.  These type of  activities seem to exacerbate it.  Recommend supportive treatment at home with naproxen and wrist brace.  Follow-up with PCP for further management. ? ?I personally ordered, reviewed, and interpreted all laboratory work and imaging and agree with radiologist interpretation. Results interpreted below: X-ray negative for fracture ? ? ?  Charting Requirements ?Additional history is obtained from:  Independent historian ?External Records from outside source obtained and reviewed including: recent orthopedic note regarding chronic opiate use ?Social Determinants of Health:  none ?Pertinant PMH that complicates patient's illness: chronic opiate use ? ?Patient Care ?Problems that were addressed during this visit: ?- left wrist pain: Acute illness ?Disposition: supportive tx, f/u with pcp ? ?Portions of this note were generated with [employer]. Dictation errors may occur despite best attempts at proofreading. ?  ? ?Final Clinical Impression(s) / ED Diagnoses ?Final diagnoses:  ?Left wrist pain  ? ? ?Rx / DC Orders ?ED Discharge Orders   ? ?      Ordered  ?  naproxen (NAPROSYN) 500 MG tablet  2 times daily       ? 03/26/21 1421  ? ?  ?  ? ?  ? ? ?  ?Adolphus Birchwood, PA-C ?03/26/21 1527 ? ?  ?Sherwood Gambler, MD ?03/27/21 1111 ? ?

## 2021-03-26 NOTE — Discharge Instructions (Signed)
Your x-rays were negative for fracture.  You should wear your wrist brace when you are doing activities such as typing, using her phone, or any other physical activity.  I will prescribe you Naproxen to take twice a day for one week. Follow up with your PCP regarding further management. ?

## 2021-03-26 NOTE — ED Triage Notes (Signed)
Pt arrives pov with c/o right wrist pain with movement  x 1 month, denies injury ?

## 2021-11-30 ENCOUNTER — Ambulatory Visit: Payer: Medicaid Other | Attending: Nurse Practitioner | Admitting: Physical Therapy

## 2021-11-30 DIAGNOSIS — M6281 Muscle weakness (generalized): Secondary | ICD-10-CM | POA: Insufficient documentation

## 2021-11-30 DIAGNOSIS — M546 Pain in thoracic spine: Secondary | ICD-10-CM | POA: Insufficient documentation

## 2021-11-30 DIAGNOSIS — R278 Other lack of coordination: Secondary | ICD-10-CM | POA: Diagnosis present

## 2021-11-30 DIAGNOSIS — R2681 Unsteadiness on feet: Secondary | ICD-10-CM | POA: Diagnosis present

## 2021-11-30 DIAGNOSIS — R262 Difficulty in walking, not elsewhere classified: Secondary | ICD-10-CM | POA: Insufficient documentation

## 2021-11-30 DIAGNOSIS — M47816 Spondylosis without myelopathy or radiculopathy, lumbar region: Secondary | ICD-10-CM | POA: Insufficient documentation

## 2021-11-30 DIAGNOSIS — R293 Abnormal posture: Secondary | ICD-10-CM | POA: Insufficient documentation

## 2021-11-30 NOTE — Therapy (Signed)
OUTPATIENT PHYSICAL THERAPY THORACOLUMBAR EVALUATION   Patient Name: Andrea Huff MRN: [id number] DOB:08-03-1960, 61 y.o., female Today's Date: 11/30/2021   PT End of Session - 11/30/21 1436     Visit Number 1    Date for PT Re-Evaluation 02/22/22    PT Start Time 9449    PT Stop Time 1224    PT Time Calculation (min) 39 min    Activity Tolerance Patient tolerated treatment well    Behavior During Therapy [facility] for tasks assessed/performed             Past Medical History:  Diagnosis Date   Abnormal Pap smear    Age 61   Anxiety    Endometrial polyp 2007   Fibroid    GERD (gastroesophageal reflux disease)    H/O varicella    Hemorrhoids 10/18/06   History of measles, mumps, or rubella    Hypertension    IBS (irritable bowel syndrome)    Left lower quadrant pain 10/18/06   Nephropathy    Ovarian cyst 2010   Past Surgical History:  Procedure Laterality Date   CARDIOVASCULAR SURGERY     CHOLECYSTECTOMY     INNER EAR SURGERY     TOE AMPUTATION     TUBAL LIGATION     WISDOM TOOTH EXTRACTION     There are no problems to display for this patient.   PCP: Cathi Roan  REFERRING PROVIDER: Burnard Hawthorne  REFERRING DIAG: Diagnosis M47.816 (ICD-10-CM) - Spondylosis without myelopathy or radiculopathy, lumbar region M54.6 (ICD-10-CM) - Pain in thoracic spine  Rationale for Evaluation and Treatment: Rehabilitation  THERAPY DIAG:  Lumbar spondylosis  Pain in thoracic spine  Muscle weakness (generalized)  Abnormal posture  Difficulty in walking, not elsewhere classified  Other lack of coordination  Unsteadiness on feet  ONSET DATE: 11/23/2021  SUBJECTIVE:                                                                                                                                                                                           SUBJECTIVE STATEMENT: Patient arrives in cast shoe due to diabetic wound on foot. She has been moving  in a W/C at home. She stands for some activities, but this causes severe back pain within several minutes. X ray revealed bone spurs. Several years ago the back Dr recommended injections for pain control, but patient is very fearful of this. Her back pain has increased dramatically since then and she is fearful that she will need the injections, but wants to try therapy first. Takes Dilauded  PERTINENT HISTORY:  Anxiety, neuropathy, DM, cervical DDD  PAIN:  Are you having pain? Yes: NPRS scale: 10/10 Pain location: mid to upper back Pain description: excruciating Aggravating factors: standing Relieving factors: sitting  PRECAUTIONS: None  WEIGHT BEARING RESTRICTIONS: No  FALLS:  Has patient fallen in last 6 months? No  LIVING ENVIRONMENT: Lives with: lives alone Lives in: House/apartment Stairs:  Has a ramp to enter, has a basement, but does not go down Has following equipment at home: Single point cane and Wheelchair (manual)  OCCUPATION: N/A  PLOF: Independent with basic ADLs, Independent with gait, and Needs assistance with homemaking  PATIENT GOALS: Relieve pain to allow her to return to her normal daily activities.  NEXT MD VISIT:   OBJECTIVE:   DIAGNOSTIC FINDINGS:   Mid and lower thoracic spine disc space narrowing and spur formation without significant change. No fractures or subluxations seen. There is no swimmer's view for evaluating the cervicothoracic region. Cholecystectomy clips.  IMPRESSION: Mid and lower thoracic spine degenerative changes.  SCREENING FOR RED FLAGS: Bowel or bladder incontinence: No Spinal tumors: No Cauda equina syndrome: No Compression fracture: No Abdominal aneurysm: No  COGNITION: Overall cognitive status: Within functional limits for tasks assessed     SENSATION: Light touch: Impaired   MUSCLE LENGTH: Hamstrings: Right 40 deg; Left 36 deg  POSTURE: rounded shoulders, forward head, increased thoracic kyphosis, and  flexed trunk   PALPATION: Upper traps tight B, more on R. TTP mid thoracic to low thoracic spine as well as lumbar paraspinals  LUMBAR ROM:   AROM eval  Flexion Mid Shin  Extension 80%   Right lateral flexion knee  Left lateral flexion Knee  Right rotation 80%  Left rotation 80%   (Blank rows = not tested)  LOWER EXTREMITY ROM:   Generally WFL, stiff throughout.  LOWER EXTREMITY MMT:    MMT Right eval Left eval  Hip flexion 3 3  Hip extension 3 3  Hip abduction    Hip adduction    Hip internal rotation    Hip external rotation    Knee flexion 3 3  Knee extension 3 3  Ankle dorsiflexion 3 3  Ankle plantarflexion    Ankle inversion    Ankle eversion     (Blank rows = not tested) OWESTRY: 36  FUNCTIONAL TESTS:  5 times sit to stand: 45.84 sec  GAIT: Distance walked: 70' Assistive device utilized: None Level of assistance: Modified independence Comments: Unstable, antalgic gait, slow, effortful, uses cane or furniture walking, limited distance to < 100'  TODAY'S TREATMENT:                                                                                                                              DATE: 11/30/21 Education    PATIENT EDUCATION:  Education details: POC Person educated: Patient Education method: Explanation Education comprehension: verbalized understanding  HOME EXERCISE PROGRAM: TBD  ASSESSMENT:  CLINICAL IMPRESSION: Patient is a 61 y.o. who was seen today for physical therapy evaluation and treatment  for lower thoracic spine pain. She has multiple other co morbidities affecting her today, including DM with a wound on L foot, wearing cast shoe-she hopes to get the cast shoe removed soon. She also B PN. She is severely debilitated due to immobility from back pain. She has spent several years mostly sitting and lying down and her trunk and extremities are all very weak. She will benefit from PT for trunk stability, mobility, B lower extremity  strengthening, balance training, functional mobility re-education, and acute pain management for her back pain in order to allow her to manage her daily needs without severe back pain.   OBJECTIVE IMPAIRMENTS: Abnormal gait, decreased activity tolerance, decreased balance, decreased coordination, decreased endurance, decreased mobility, difficulty walking, decreased strength, increased muscle spasms, improper body mechanics, postural dysfunction, and pain.   ACTIVITY LIMITATIONS: carrying, lifting, bending, standing, squatting, sleeping, stairs, bathing, toileting, dressing, and locomotion level  PARTICIPATION LIMITATIONS: meal prep, cleaning, laundry, driving, and shopping  PERSONAL FACTORS: Past/current experiences and 1-2 comorbidities: DM, PN  are also affecting patient's functional outcome.   REHAB POTENTIAL: Good  CLINICAL DECISION MAKING: Evolving/moderate complexity  EVALUATION COMPLEXITY: High   GOALS: Goals reviewed with patient? Yes  SHORT TERM GOALS: Target date: 12/28/2021  I with basic HEP Baseline: Goal status: INITIAL  LONG TERM GOALS: Target date: 02/22/2022  I with final HEP Baseline:  Goal status: INITIAL  2.  Improve 5 x STS to < 25 sec to demonstrate clinically significant improvement in strength. Baseline: 45 Goal status: INITIAL  3.  Decrease Owestry score to <20 to demonstrate significant improvement in pain. Baseline: 36 Goal status: INITIAL  4.  Patient will tolerate > 10 minutes of standing/walking activities with back pain < 4/10 Baseline: 10/10 Goal status: INITIAL  5.  Patient will be able to perform her normal daily activities with back pain < 4/10 Baseline: 10/10 Goal status: INITIAL  PLAN:  PT FREQUENCY: 2x/week  PT DURATION: 12 weeks  PLANNED INTERVENTIONS: Therapeutic exercises, Therapeutic activity, Neuromuscular re-education, Balance training, Gait training, Patient/Family education, Self Care, Joint mobilization, Dry Needling,  and Manual therapy.  PLAN FOR NEXT SESSION: HEP **[employer] does not cover MH, CP, mech traction, Ionto, estim, vaso   Marcelina Morel, DPT 11/30/2021, 2:59 PM

## 2021-12-08 ENCOUNTER — Encounter: Payer: Self-pay | Admitting: Physical Therapy

## 2021-12-08 ENCOUNTER — Ambulatory Visit: Payer: Medicaid Other | Admitting: Physical Therapy

## 2021-12-08 DIAGNOSIS — M6281 Muscle weakness (generalized): Secondary | ICD-10-CM

## 2021-12-08 DIAGNOSIS — R2681 Unsteadiness on feet: Secondary | ICD-10-CM

## 2021-12-08 DIAGNOSIS — R293 Abnormal posture: Secondary | ICD-10-CM

## 2021-12-08 DIAGNOSIS — M47816 Spondylosis without myelopathy or radiculopathy, lumbar region: Secondary | ICD-10-CM

## 2021-12-08 DIAGNOSIS — R262 Difficulty in walking, not elsewhere classified: Secondary | ICD-10-CM

## 2021-12-08 DIAGNOSIS — M546 Pain in thoracic spine: Secondary | ICD-10-CM

## 2021-12-08 NOTE — Therapy (Signed)
OUTPATIENT PHYSICAL THERAPY THORACOLUMBAR EVALUATION   Patient Name: Andrea Huff MRN: [id number] DOB:Mar 11, 1960, 61 y.o., female Today's Date: 12/08/2021   PT End of Session - 12/08/21 1012     Visit Number 2    PT Start Time 2440    PT Stop Time 1100    PT Time Calculation (min) 45 min    Activity Tolerance Patient tolerated treatment well    Behavior During Therapy [facility] for tasks assessed/performed             Past Medical History:  Diagnosis Date   Abnormal Pap smear    Age 50   Anxiety    Endometrial polyp 2007   Fibroid    GERD (gastroesophageal reflux disease)    H/O varicella    Hemorrhoids 10/18/06   History of measles, mumps, or rubella    Hypertension    IBS (irritable bowel syndrome)    Left lower quadrant pain 10/18/06   Nephropathy    Ovarian cyst 2010   Past Surgical History:  Procedure Laterality Date   CARDIOVASCULAR SURGERY     CHOLECYSTECTOMY     INNER EAR SURGERY     TOE AMPUTATION     TUBAL LIGATION     WISDOM TOOTH EXTRACTION     There are no problems to display for this patient.   PCP: Cathi Roan  REFERRING PROVIDER: Burnard Hawthorne  REFERRING DIAG: Diagnosis M47.816 (ICD-10-CM) - Spondylosis without myelopathy or radiculopathy, lumbar region M54.6 (ICD-10-CM) - Pain in thoracic spine  Rationale for Evaluation and Treatment: Rehabilitation  THERAPY DIAG:  Lumbar spondylosis  Pain in thoracic spine  Muscle weakness (generalized)  Abnormal posture  Difficulty in walking, not elsewhere classified  Unsteadiness on feet  ONSET DATE: 11/23/2021  SUBJECTIVE:                                                                                                                                                                                           SUBJECTIVE STATEMENT: "Im feeling good, my back is bothering me right now"  PERTINENT HISTORY:  Anxiety, neuropathy, DM, cervical DDD  PAIN:  Are you having pain?  Yes: NPRS scale: 7/10 Pain location: mid to upper back Pain description: excruciating Aggravating factors: standing Relieving factors: sitting  PRECAUTIONS: None  WEIGHT BEARING RESTRICTIONS: No  FALLS:  Has patient fallen in last 6 months? No  LIVING ENVIRONMENT: Lives with: lives alone Lives in: House/apartment Stairs:  Has a ramp to enter, has a basement, but does not go down Has following equipment at home: Single point cane and Wheelchair (manual)  OCCUPATION: N/A  PLOF: Independent with basic  ADLs, Independent with gait, and Needs assistance with homemaking  PATIENT GOALS: Relieve pain to allow her to return to her normal daily activities.  NEXT MD VISIT:   OBJECTIVE:   DIAGNOSTIC FINDINGS:   Mid and lower thoracic spine disc space narrowing and spur formation without significant change. No fractures or subluxations seen. There is no swimmer's view for evaluating the cervicothoracic region. Cholecystectomy clips.  IMPRESSION: Mid and lower thoracic spine degenerative changes.  SCREENING FOR RED FLAGS: Bowel or bladder incontinence: No Spinal tumors: No Cauda equina syndrome: No Compression fracture: No Abdominal aneurysm: No  COGNITION: Overall cognitive status: Within functional limits for tasks assessed     SENSATION: Light touch: Impaired   MUSCLE LENGTH: Hamstrings: Right 40 deg; Left 36 deg  POSTURE: rounded shoulders, forward head, increased thoracic kyphosis, and flexed trunk   PALPATION: Upper traps tight B, more on R. TTP mid thoracic to low thoracic spine as well as lumbar paraspinals  LUMBAR ROM:   AROM eval  Flexion Mid Shin  Extension 80%   Right lateral flexion knee  Left lateral flexion Knee  Right rotation 80%  Left rotation 80%   (Blank rows = not tested)  LOWER EXTREMITY ROM:   Generally WFL, stiff throughout.  LOWER EXTREMITY MMT:    MMT Right eval Left eval  Hip flexion 3 3  Hip extension 3 3  Hip abduction     Hip adduction    Hip internal rotation    Hip external rotation    Knee flexion 3 3  Knee extension 3 3  Ankle dorsiflexion 3 3  Ankle plantarflexion    Ankle inversion    Ankle eversion     (Blank rows = not tested) OWESTRY: 36  FUNCTIONAL TESTS:  5 times sit to stand: 45.84 sec  GAIT: Distance walked: 59' Assistive device utilized: None Level of assistance: Modified independence Comments: Unstable, antalgic gait, slow, effortful, uses cane or furniture walking, limited distance to < 100'  TODAY'S TREATMENT:                                                                                                                              DATE:  12/08/21 UBE L1 x 2 min each NuStep L3 x 4 min  S2S with UE use, then without UE use form elevated surface HS curls green 2x10 Seated rows red 2x10   11/30/21 Education    PATIENT EDUCATION:  Education details: POC Person educated: Patient Education method: Explanation Education comprehension: verbalized understanding  HOME EXERCISE PROGRAM: TBD  ASSESSMENT:  CLINICAL IMPRESSION: Pt enters with reports of back pain. She expressed some initial concern about therapy hurting. Interventions kept with light resistance. She had the most difficulty with sit to stands, often leaning to her R side. Pt stated the neuropathy made it difficult to feel her feet. She did correct the uneven weight distribution with cues. Cue for full ROM needed with leg extension.  OBJECTIVE IMPAIRMENTS:  Abnormal gait, decreased activity tolerance, decreased balance, decreased coordination, decreased endurance, decreased mobility, difficulty walking, decreased strength, increased muscle spasms, improper body mechanics, postural dysfunction, and pain.   ACTIVITY LIMITATIONS: carrying, lifting, bending, standing, squatting, sleeping, stairs, bathing, toileting, dressing, and locomotion level  PARTICIPATION LIMITATIONS: meal prep, cleaning, laundry, driving, and  shopping  PERSONAL FACTORS: Past/current experiences and 1-2 comorbidities: DM, PN  are also affecting patient's functional outcome.   REHAB POTENTIAL: Good  CLINICAL DECISION MAKING: Evolving/moderate complexity  EVALUATION COMPLEXITY: High   GOALS: Goals reviewed with patient? Yes  SHORT TERM GOALS: Target date: 12/28/2021  I with basic HEP Baseline: Goal status: INITIAL  LONG TERM GOALS: Target date: 02/22/2022  I with final HEP Baseline:  Goal status: INITIAL  2.  Improve 5 x STS to < 25 sec to demonstrate clinically significant improvement in strength. Baseline: 45 Goal status: INITIAL  3.  Decrease Owestry score to <20 to demonstrate significant improvement in pain. Baseline: 36 Goal status: INITIAL  4.  Patient will tolerate > 10 minutes of standing/walking activities with back pain < 4/10 Baseline: 10/10 Goal status: INITIAL  5.  Patient will be able to perform her normal daily activities with back pain < 4/10 Baseline: 10/10 Goal status: INITIAL  PLAN:  PT FREQUENCY: 2x/week  PT DURATION: 12 weeks  PLANNED INTERVENTIONS: Therapeutic exercises, Therapeutic activity, Neuromuscular re-education, Balance training, Gait training, Patient/Family education, Self Care, Joint mobilization, Dry Needling, and Manual therapy.  PLAN FOR NEXT SESSION: HEP **[employer] does not cover MH, CP, mech traction, Ionto, estim, vaso   Marcelina Morel, DPT 12/08/2021, 10:13 AM

## 2021-12-12 ENCOUNTER — Ambulatory Visit: Payer: Medicaid Other | Admitting: Physical Therapy

## 2021-12-12 DIAGNOSIS — R293 Abnormal posture: Secondary | ICD-10-CM

## 2021-12-12 DIAGNOSIS — M6281 Muscle weakness (generalized): Secondary | ICD-10-CM

## 2021-12-12 DIAGNOSIS — R278 Other lack of coordination: Secondary | ICD-10-CM

## 2021-12-12 DIAGNOSIS — R262 Difficulty in walking, not elsewhere classified: Secondary | ICD-10-CM

## 2021-12-12 DIAGNOSIS — R2681 Unsteadiness on feet: Secondary | ICD-10-CM

## 2021-12-12 DIAGNOSIS — M47816 Spondylosis without myelopathy or radiculopathy, lumbar region: Secondary | ICD-10-CM

## 2021-12-12 DIAGNOSIS — M546 Pain in thoracic spine: Secondary | ICD-10-CM

## 2021-12-12 NOTE — Therapy (Signed)
OUTPATIENT PHYSICAL THERAPY THORACOLUMBAR EVALUATION   Patient Name: Andrea Huff MRN: [id number] DOB:[birth year], 61 y.o., female Today's Date: 12/12/2021   PT End of Session - 12/12/21 1247     Visit Number 3    Date for PT Re-Evaluation 02/22/22    PT Start Time 1242    PT Stop Time 1312    PT Time Calculation (min) 30 min    Activity Tolerance Patient tolerated treatment well    Behavior During Therapy [facility] for tasks assessed/performed              Past Medical History:  Diagnosis Date   Abnormal Pap smear    Age 61   Anxiety    Endometrial polyp 2007   Fibroid    GERD (gastroesophageal reflux disease)    H/O varicella    Hemorrhoids 10/18/06   History of measles, mumps, or rubella    Hypertension    IBS (irritable bowel syndrome)    Left lower quadrant pain 10/18/06   Nephropathy    Ovarian cyst 2010   Past Surgical History:  Procedure Laterality Date   CARDIOVASCULAR SURGERY     CHOLECYSTECTOMY     INNER EAR SURGERY     TOE AMPUTATION     TUBAL LIGATION     WISDOM TOOTH EXTRACTION     There are no problems to display for this patient.   PCP: Cathi Roan  REFERRING PROVIDER: Burnard Hawthorne  REFERRING DIAG: Diagnosis M47.816 (ICD-10-CM) - Spondylosis without myelopathy or radiculopathy, lumbar region M54.6 (ICD-10-CM) - Pain in thoracic spine  Rationale for Evaluation and Treatment: Rehabilitation  THERAPY DIAG:  Lumbar spondylosis  Pain in thoracic spine  Muscle weakness (generalized)  Abnormal posture  Difficulty in walking, not elsewhere classified  Unsteadiness on feet  Other lack of coordination  ONSET DATE: 11/23/2021  SUBJECTIVE:                                                                                                                                                                                           SUBJECTIVE STATEMENT: Patient reports that she developed a wound on her R foot, arrived wearing a  cast shoe. The Dr told her to stay off her feet as much as possible. Her back pain is still severe.  PERTINENT HISTORY:  Anxiety, neuropathy, DM, cervical DDD  PAIN:  Are you having pain? Yes: NPRS scale: 7/10 Pain location: mid to upper back Pain description: excruciating Aggravating factors: standing Relieving factors: sitting  PRECAUTIONS: None  WEIGHT BEARING RESTRICTIONS: No  FALLS:  Has patient fallen in last 6 months? No  LIVING ENVIRONMENT: Lives with:  lives alone Lives in: House/apartment Stairs:  Has a ramp to enter, has a basement, but does not go down Has following equipment at home: Single point cane and Wheelchair (manual)  OCCUPATION: N/A  PLOF: Independent with basic ADLs, Independent with gait, and Needs assistance with homemaking  PATIENT GOALS: Relieve pain to allow her to return to her normal daily activities.  NEXT MD VISIT:   OBJECTIVE:   DIAGNOSTIC FINDINGS:   Mid and lower thoracic spine disc space narrowing and spur formation without significant change. No fractures or subluxations seen. There is no swimmer's view for evaluating the cervicothoracic region. Cholecystectomy clips.  IMPRESSION: Mid and lower thoracic spine degenerative changes.  SCREENING FOR RED FLAGS: Bowel or bladder incontinence: No Spinal tumors: No Cauda equina syndrome: No Compression fracture: No Abdominal aneurysm: No  COGNITION: Overall cognitive status: Within functional limits for tasks assessed     SENSATION: Light touch: Impaired   MUSCLE LENGTH: Hamstrings: Right 40 deg; Left 36 deg  POSTURE: rounded shoulders, forward head, increased thoracic kyphosis, and flexed trunk   PALPATION: Upper traps tight B, more on R. TTP mid thoracic to low thoracic spine as well as lumbar paraspinals  LUMBAR ROM:   AROM eval  Flexion Mid Shin  Extension 80%   Right lateral flexion knee  Left lateral flexion Knee  Right rotation 80%  Left rotation 80%    (Blank rows = not tested)  LOWER EXTREMITY ROM:   Generally WFL, stiff throughout.  LOWER EXTREMITY MMT:    MMT Right eval Left eval  Hip flexion 3 3  Hip extension 3 3  Hip abduction    Hip adduction    Hip internal rotation    Hip external rotation    Knee flexion 3 3  Knee extension 3 3  Ankle dorsiflexion 3 3  Ankle plantarflexion    Ankle inversion    Ankle eversion     (Blank rows = not tested) OWESTRY: 36  FUNCTIONAL TESTS:  5 times sit to stand: 45.84 sec  GAIT: Distance walked: 73' Assistive device utilized: None Level of assistance: Modified independence Comments: Unstable, antalgic gait, slow, effortful, uses cane or furniture walking, limited distance to < 100'  TODAY'S TREATMENT:                                                                                                                              DATE:  12/12/21 UBE L1 x 2 min for/back Supine stretching, SKTC, DKTC, figure 4, 3 x 15 sec B Pelvic tilt-10 reps, 5 sec hold Seated HS stretch, 3 x 15 sec B  12/08/21 UBE L1 x 2 min each NuStep L3 x 4 min  S2S with UE use, then without UE use form elevated surface HS curls green 2x10 Seated rows red 2x10   11/30/21 Education    PATIENT EDUCATION:  Education details: POC Person educated: Patient Education method: Explanation Education comprehension: verbalized understanding  HOME EXERCISE PROGRAM:  [id number]  ASSESSMENT:  CLINICAL IMPRESSION: Pt arrived a bit late. She is wearing a cast shoe on her R foot today, reports that she developed a wound on her foot and the Dr told her to avoid activities on her feet. Initiated HEP with stretching.  OBJECTIVE IMPAIRMENTS: Abnormal gait, decreased activity tolerance, decreased balance, decreased coordination, decreased endurance, decreased mobility, difficulty walking, decreased strength, increased muscle spasms, improper body mechanics, postural dysfunction, and pain.   ACTIVITY LIMITATIONS:  carrying, lifting, bending, standing, squatting, sleeping, stairs, bathing, toileting, dressing, and locomotion level  PARTICIPATION LIMITATIONS: meal prep, cleaning, laundry, driving, and shopping  PERSONAL FACTORS: Past/current experiences and 1-2 comorbidities: DM, PN  are also affecting patient's functional outcome.   REHAB POTENTIAL: Good  CLINICAL DECISION MAKING: Evolving/moderate complexity  EVALUATION COMPLEXITY: High   GOALS: Goals reviewed with patient? Yes  SHORT TERM GOALS: Target date: 12/28/2021  I with basic HEP Baseline: Goal status: ongoing  LONG TERM GOALS: Target date: 02/22/2022  I with final HEP Baseline:  Goal status: INITIAL  2.  Improve 5 x STS to < 25 sec to demonstrate clinically significant improvement in strength. Baseline: 45 Goal status: INITIAL  3.  Decrease Owestry score to <20 to demonstrate significant improvement in pain. Baseline: 36 Goal status: INITIAL  4.  Patient will tolerate > 10 minutes of standing/walking activities with back pain < 4/10 Baseline: 10/10 Goal status: ongoing  5.  Patient will be able to perform her normal daily activities with back pain < 4/10 Baseline: 10/10 Goal status: ongoing  PLAN:  PT FREQUENCY: 2x/week  PT DURATION: 12 weeks  PLANNED INTERVENTIONS: Therapeutic exercises, Therapeutic activity, Neuromuscular re-education, Balance training, Gait training, Patient/Family education, Self Care, Joint mobilization, Dry Needling, and Manual therapy.  PLAN FOR NEXT SESSION: Assess how HEP went, add strengthening, cannot do standing exercises. **[employer] does not cover MH, CP, mech traction, Ionto, estim, vaso   Marcelina Morel, DPT 12/12/2021, 1:13 PM

## 2021-12-18 ENCOUNTER — Ambulatory Visit: Payer: Medicaid Other | Admitting: Physical Therapy

## 2021-12-18 ENCOUNTER — Encounter: Payer: Self-pay | Admitting: Physical Therapy

## 2021-12-18 DIAGNOSIS — M6281 Muscle weakness (generalized): Secondary | ICD-10-CM

## 2021-12-18 DIAGNOSIS — R293 Abnormal posture: Secondary | ICD-10-CM

## 2021-12-18 DIAGNOSIS — R2681 Unsteadiness on feet: Secondary | ICD-10-CM

## 2021-12-18 DIAGNOSIS — M47816 Spondylosis without myelopathy or radiculopathy, lumbar region: Secondary | ICD-10-CM | POA: Diagnosis not present

## 2021-12-18 DIAGNOSIS — M546 Pain in thoracic spine: Secondary | ICD-10-CM

## 2021-12-18 DIAGNOSIS — R262 Difficulty in walking, not elsewhere classified: Secondary | ICD-10-CM

## 2021-12-18 DIAGNOSIS — R278 Other lack of coordination: Secondary | ICD-10-CM

## 2021-12-18 NOTE — Therapy (Signed)
OUTPATIENT PHYSICAL THERAPY THORACOLUMBAR EVALUATION   Patient Name: Andrea Huff MRN: [id number] DOB:[birth year], 61 y.o., female Today's Date: 12/18/2021   PT End of Session - 12/18/21 0939     Visit Number 4    Date for PT Re-Evaluation 02/22/22    PT Start Time 0935    PT Stop Time 1010    PT Time Calculation (min) 35 min    Activity Tolerance Patient tolerated treatment well    Behavior During Therapy [facility] for tasks assessed/performed               Past Medical History:  Diagnosis Date   Abnormal Pap smear    Age 59   Anxiety    Endometrial polyp 2007   Fibroid    GERD (gastroesophageal reflux disease)    H/O varicella    Hemorrhoids 10/18/06   History of measles, mumps, or rubella    Hypertension    IBS (irritable bowel syndrome)    Left lower quadrant pain 10/18/06   Nephropathy    Ovarian cyst 2010   Past Surgical History:  Procedure Laterality Date   CARDIOVASCULAR SURGERY     CHOLECYSTECTOMY     INNER EAR SURGERY     TOE AMPUTATION     TUBAL LIGATION     WISDOM TOOTH EXTRACTION     There are no problems to display for this patient.   PCP: Cathi Roan  REFERRING PROVIDER: Burnard Hawthorne  REFERRING DIAG: Diagnosis M47.816 (ICD-10-CM) - Spondylosis without myelopathy or radiculopathy, lumbar region M54.6 (ICD-10-CM) - Pain in thoracic spine  Rationale for Evaluation and Treatment: Rehabilitation  THERAPY DIAG:  Pain in thoracic spine  Muscle weakness (generalized)  Abnormal posture  Difficulty in walking, not elsewhere classified  Unsteadiness on feet  Other lack of coordination  ONSET DATE: 11/23/2021  SUBJECTIVE:                                                                                                                                                                                           SUBJECTIVE STATEMENT: Patient reports that she is still hurting badly. She is performing the stretches, but is still  having the pain.  PERTINENT HISTORY:  Anxiety, neuropathy, DM, cervical DDD  PAIN:  Are you having pain? Yes: NPRS scale: 7/10 Pain location: mid to upper back Pain description: excruciating Aggravating factors: standing Relieving factors: sitting  PRECAUTIONS: None  WEIGHT BEARING RESTRICTIONS: No  FALLS:  Has patient fallen in last 6 months? No  LIVING ENVIRONMENT: Lives with: lives alone Lives in: House/apartment Stairs:  Has a ramp to enter, has a basement, but does not  go down Has following equipment at home: Single point cane and Wheelchair (manual)  OCCUPATION: N/A  PLOF: Independent with basic ADLs, Independent with gait, and Needs assistance with homemaking  PATIENT GOALS: Relieve pain to allow her to return to her normal daily activities.  NEXT MD VISIT:   OBJECTIVE:   DIAGNOSTIC FINDINGS:   Mid and lower thoracic spine disc space narrowing and spur formation without significant change. No fractures or subluxations seen. There is no swimmer's view for evaluating the cervicothoracic region. Cholecystectomy clips.  IMPRESSION: Mid and lower thoracic spine degenerative changes.  SCREENING FOR RED FLAGS: Bowel or bladder incontinence: No Spinal tumors: No Cauda equina syndrome: No Compression fracture: No Abdominal aneurysm: No  COGNITION: Overall cognitive status: Within functional limits for tasks assessed     SENSATION: Light touch: Impaired   MUSCLE LENGTH: Hamstrings: Right 40 deg; Left 36 deg  POSTURE: rounded shoulders, forward head, increased thoracic kyphosis, and flexed trunk   PALPATION: Upper traps tight B, more on R. TTP mid thoracic to low thoracic spine as well as lumbar paraspinals  LUMBAR ROM:   AROM eval  Flexion Mid Shin  Extension 80%   Right lateral flexion knee  Left lateral flexion Knee  Right rotation 80%  Left rotation 80%   (Blank rows = not tested)  LOWER EXTREMITY ROM:   Generally WFL, stiff  throughout.  LOWER EXTREMITY MMT:    MMT Right eval Left eval  Hip flexion 3 3  Hip extension 3 3  Hip abduction    Hip adduction    Hip internal rotation    Hip external rotation    Knee flexion 3 3  Knee extension 3 3  Ankle dorsiflexion 3 3  Ankle plantarflexion    Ankle inversion    Ankle eversion     (Blank rows = not tested) OWESTRY: 36  FUNCTIONAL TESTS:  5 times sit to stand: 45.84 sec  GAIT: Distance walked: 1' Assistive device utilized: None Level of assistance: Modified independence Comments: Unstable, antalgic gait, slow, effortful, uses cane or furniture walking, limited distance to < 100'  TODAY'S TREATMENT:                                                                                                                              DATE:  12/18/21 UBE L1 x 3 minutes for/3 back Seated trunk rotation/weight shifts to each side x 5 Seated rotation walkouts and back for trunk strength Seated lower trunk mobilization while holding 1# weight to stabilize upper trunk Supine bridge 2 x 10 Supine clamshells against green tband 2 x 10 reps, SLR x 10, SLR with ER x 10 each leg  12/12/21 UBE L1 x 2 min for/back Supine stretching, SKTC, DKTC, figure 4, 3 x 15 sec B Pelvic tilt-10 reps, 5 sec hold Seated HS stretch, 3 x 15 sec B  12/08/21 UBE L1 x 2 min each NuStep L3 x 4 min  S2S with  UE use, then without UE use form elevated surface HS curls green 2x10 Seated rows red 2x10   11/30/21 Education    PATIENT EDUCATION:  Education details: POC Person educated: Patient Education method: Explanation Education comprehension: verbalized understanding  HOME EXERCISE PROGRAM: [id number]  ASSESSMENT:  CLINICAL IMPRESSION: Pt reports continued pain. She is frustrated with chronic pain. Encouraged her to continue HEP for mobility, added strengthening to try to improve trunk cotnrol and strength to manage her pain better  OBJECTIVE IMPAIRMENTS: Abnormal gait,  decreased activity tolerance, decreased balance, decreased coordination, decreased endurance, decreased mobility, difficulty walking, decreased strength, increased muscle spasms, improper body mechanics, postural dysfunction, and pain.   ACTIVITY LIMITATIONS: carrying, lifting, bending, standing, squatting, sleeping, stairs, bathing, toileting, dressing, and locomotion level  PARTICIPATION LIMITATIONS: meal prep, cleaning, laundry, driving, and shopping  PERSONAL FACTORS: Past/current experiences and 1-2 comorbidities: DM, PN  are also affecting patient's functional outcome.   REHAB POTENTIAL: Good  CLINICAL DECISION MAKING: Evolving/moderate complexity  EVALUATION COMPLEXITY: High   GOALS: Goals reviewed with patient? Yes  SHORT TERM GOALS: Target date: 12/28/2021  I with basic HEP Baseline: Goal status: ongoing  LONG TERM GOALS: Target date: 02/22/2022  I with final HEP Baseline:  Goal status: INITIAL  2.  Improve 5 x STS to < 25 sec to demonstrate clinically significant improvement in strength. Baseline: 45 Goal status: ongoing  3.  Decrease Owestry score to <20 to demonstrate significant improvement in pain. Baseline: 36 Goal status: INITIAL  4.  Patient will tolerate > 10 minutes of standing/walking activities with back pain < 4/10 Baseline: 10/10 Goal status: ongoing  5.  Patient will be able to perform her normal daily activities with back pain < 4/10 Baseline: 10/10 Goal status: ongoing  PLAN:  PT FREQUENCY: 2x/week  PT DURATION: 12 weeks  PLANNED INTERVENTIONS: Therapeutic exercises, Therapeutic activity, Neuromuscular re-education, Balance training, Gait training, Patient/Family education, Self Care, Joint mobilization, Dry Needling, and Manual therapy.  PLAN FOR NEXT SESSION: Assess how HEP went, with add strengthening **[employer] does not cover MH, CP, mech traction, Ionto, estim, vaso   Marcelina Morel, DPT 12/18/2021, 11:03 AM

## 2021-12-20 ENCOUNTER — Ambulatory Visit: Payer: Medicaid Other | Admitting: Physical Therapy

## 2021-12-20 ENCOUNTER — Encounter: Payer: Self-pay | Admitting: Physical Therapy

## 2021-12-20 DIAGNOSIS — R2681 Unsteadiness on feet: Secondary | ICD-10-CM

## 2021-12-20 DIAGNOSIS — M47816 Spondylosis without myelopathy or radiculopathy, lumbar region: Secondary | ICD-10-CM | POA: Diagnosis not present

## 2021-12-20 DIAGNOSIS — M546 Pain in thoracic spine: Secondary | ICD-10-CM

## 2021-12-20 DIAGNOSIS — R278 Other lack of coordination: Secondary | ICD-10-CM

## 2021-12-20 DIAGNOSIS — M6281 Muscle weakness (generalized): Secondary | ICD-10-CM

## 2021-12-20 DIAGNOSIS — R293 Abnormal posture: Secondary | ICD-10-CM

## 2021-12-20 DIAGNOSIS — R262 Difficulty in walking, not elsewhere classified: Secondary | ICD-10-CM

## 2021-12-20 NOTE — Therapy (Signed)
OUTPATIENT PHYSICAL THERAPY THORACOLUMBAR EVALUATION   Patient Name: Andrea Huff MRN: [id number] DOB:[birth year], 61 y.o., female Today's Date: 12/20/2021   PT End of Session - 12/20/21 1107     Visit Number 5    Date for PT Re-Evaluation 11/23/21    PT Start Time 1102    PT Stop Time 1140    PT Time Calculation (min) 38 min    Activity Tolerance Patient tolerated treatment well    Behavior During Therapy [facility] for tasks assessed/performed                Past Medical History:  Diagnosis Date   Abnormal Pap smear    Age 32   Anxiety    Endometrial polyp 2007   Fibroid    GERD (gastroesophageal reflux disease)    H/O varicella    Hemorrhoids 10/18/06   History of measles, mumps, or rubella    Hypertension    IBS (irritable bowel syndrome)    Left lower quadrant pain 10/18/06   Nephropathy    Ovarian cyst 2010   Past Surgical History:  Procedure Laterality Date   CARDIOVASCULAR SURGERY     CHOLECYSTECTOMY     INNER EAR SURGERY     TOE AMPUTATION     TUBAL LIGATION     WISDOM TOOTH EXTRACTION     There are no problems to display for this patient.   PCP: Cathi Roan  REFERRING PROVIDER: Burnard Hawthorne  REFERRING DIAG: Diagnosis M47.816 (ICD-10-CM) - Spondylosis without myelopathy or radiculopathy, lumbar region M54.6 (ICD-10-CM) - Pain in thoracic spine  Rationale for Evaluation and Treatment: Rehabilitation  THERAPY DIAG:  Muscle weakness (generalized)  Abnormal posture  Other lack of coordination  Unsteadiness on feet  Difficulty in walking, not elsewhere classified  Lumbar spondylosis  Pain in thoracic spine  ONSET DATE: 11/23/2021  SUBJECTIVE:                                                                                                                                                                                           SUBJECTIVE STATEMENT: Patient arrives still wearing her cast shoe on R. Dr was unable to  remove it, needs to allow the foot to heal more. Her back pain continues to be severe. She brought her TNS unit with her today.  PERTINENT HISTORY:  Anxiety, neuropathy, DM, cervical DDD  PAIN:  Are you having pain? Yes: NPRS scale: 7/10 Pain location: mid to upper back Pain description: excruciating Aggravating factors: standing Relieving factors: sitting  PRECAUTIONS: None  WEIGHT BEARING RESTRICTIONS: No  FALLS:  Has patient fallen in last 6 months? No  LIVING ENVIRONMENT: Lives with: lives alone Lives in: House/apartment Stairs:  Has a ramp to enter, has a basement, but does not go down Has following equipment at home: Single point cane and Wheelchair (manual)  OCCUPATION: N/A  PLOF: Independent with basic ADLs, Independent with gait, and Needs assistance with homemaking  PATIENT GOALS: Relieve pain to allow her to return to her normal daily activities.  NEXT MD VISIT:   OBJECTIVE:   DIAGNOSTIC FINDINGS:   Mid and lower thoracic spine disc space narrowing and spur formation without significant change. No fractures or subluxations seen. There is no swimmer's view for evaluating the cervicothoracic region. Cholecystectomy clips.  IMPRESSION: Mid and lower thoracic spine degenerative changes.  SCREENING FOR RED FLAGS: Bowel or bladder incontinence: No Spinal tumors: No Cauda equina syndrome: No Compression fracture: No Abdominal aneurysm: No  COGNITION: Overall cognitive status: Within functional limits for tasks assessed     SENSATION: Light touch: Impaired   MUSCLE LENGTH: Hamstrings: Right 40 deg; Left 36 deg  POSTURE: rounded shoulders, forward head, increased thoracic kyphosis, and flexed trunk   PALPATION: Upper traps tight B, more on R. TTP mid thoracic to low thoracic spine as well as lumbar paraspinals  LUMBAR ROM:   AROM eval  Flexion Mid Shin  Extension 80%   Right lateral flexion knee  Left lateral flexion Knee  Right rotation 80%   Left rotation 80%   (Blank rows = not tested)  LOWER EXTREMITY ROM:   Generally WFL, stiff throughout.  LOWER EXTREMITY MMT:    MMT Right eval Left eval  Hip flexion 3 3  Hip extension 3 3  Hip abduction    Hip adduction    Hip internal rotation    Hip external rotation    Knee flexion 3 3  Knee extension 3 3  Ankle dorsiflexion 3 3  Ankle plantarflexion    Ankle inversion    Ankle eversion     (Blank rows = not tested) OWESTRY: 36  FUNCTIONAL TESTS:  5 times sit to stand: 45.84 sec  GAIT: Distance walked: 55' Assistive device utilized: None Level of assistance: Modified independence Comments: Unstable, antalgic gait, slow, effortful, uses cane or furniture walking, limited distance to < 100'  TODAY'S TREATMENT:                                                                                                                              DATE:  12/20/21 UBE L1 x 3 min for/3 min back Set up patient's TNS/massage unit across lower T spine to relieve severe pain.  12/18/21 UBE L1 x 3 minutes for/3 back Seated trunk rotation/weight shifts to each side x 5 Seated rotation walkouts and back for trunk strength Seated lower trunk mobilization while holding 1# weight to stabilize upper trunk Supine bridge 2 x 10 Supine clamshells against green tband 2 x 10 reps, SLR x 10, SLR with ER x 10 each leg  12/12/21 UBE L1  x 2 min for/back Supine stretching, SKTC, DKTC, figure 4, 3 x 15 sec B Pelvic tilt-10 reps, 5 sec hold Seated HS stretch, 3 x 15 sec B  12/08/21 UBE L1 x 2 min each NuStep L3 x 4 min  S2S with UE use, then without UE use form elevated surface HS curls green 2x10 Seated rows red 2x10   11/30/21 Education    PATIENT EDUCATION:  Education details: POC Person educated: Patient Education method: Explanation Education comprehension: verbalized understanding  HOME EXERCISE PROGRAM: [id number]  ASSESSMENT:  CLINICAL IMPRESSION: Pt reports continued  pain. She brought in her TNS unit from home. Therapist facilitated set up for pain relief. Patient reported almost immediate relief from pain. Will assess unit over the weekend prior to return to therapy next week.  OBJECTIVE IMPAIRMENTS: Abnormal gait, decreased activity tolerance, decreased balance, decreased coordination, decreased endurance, decreased mobility, difficulty walking, decreased strength, increased muscle spasms, improper body mechanics, postural dysfunction, and pain.   ACTIVITY LIMITATIONS: carrying, lifting, bending, standing, squatting, sleeping, stairs, bathing, toileting, dressing, and locomotion level  PARTICIPATION LIMITATIONS: meal prep, cleaning, laundry, driving, and shopping  PERSONAL FACTORS: Past/current experiences and 1-2 comorbidities: DM, PN  are also affecting patient's functional outcome.   REHAB POTENTIAL: Good  CLINICAL DECISION MAKING: Evolving/moderate complexity  EVALUATION COMPLEXITY: High   GOALS: Goals reviewed with patient? Yes  SHORT TERM GOALS: Target date: 12/28/2021  I with basic HEP Baseline: Goal status: ongoing  LONG TERM GOALS: Target date: 02/22/2022  I with final HEP Baseline:  Goal status: INITIAL  2.  Improve 5 x STS to < 25 sec to demonstrate clinically significant improvement in strength. Baseline: 45 Goal status: ongoing  3.  Decrease Owestry score to <20 to demonstrate significant improvement in pain. Baseline: 36 Goal status: ongoing  4.  Patient will tolerate > 10 minutes of standing/walking activities with back pain < 4/10 Baseline: 10/10 Goal status: ongoing  5.  Patient will be able to perform her normal daily activities with back pain < 4/10 Baseline: 10/10 Goal status: ongoing  PLAN:  PT FREQUENCY: 2x/week  PT DURATION: 12 weeks  PLANNED INTERVENTIONS: Therapeutic exercises, Therapeutic activity, Neuromuscular re-education, Balance training, Gait training, Patient/Family education, Self Care, Joint  mobilization, Dry Needling, and Manual therapy.  PLAN FOR NEXT SESSION: Assess TNS, how did it do? **[employer] does not cover MH, CP, mech traction, Ionto, estim, vaso   Marcelina Morel, DPT 12/20/2021, 11:45 AM

## 2021-12-25 ENCOUNTER — Ambulatory Visit: Payer: Medicaid Other | Admitting: Physical Therapy

## 2021-12-27 ENCOUNTER — Ambulatory Visit: Payer: Medicaid Other | Attending: Nurse Practitioner | Admitting: Physical Therapy

## 2021-12-27 ENCOUNTER — Encounter: Payer: Self-pay | Admitting: Physical Therapy

## 2021-12-27 DIAGNOSIS — R2681 Unsteadiness on feet: Secondary | ICD-10-CM | POA: Diagnosis present

## 2021-12-27 DIAGNOSIS — R278 Other lack of coordination: Secondary | ICD-10-CM | POA: Diagnosis present

## 2021-12-27 DIAGNOSIS — M546 Pain in thoracic spine: Secondary | ICD-10-CM | POA: Diagnosis present

## 2021-12-27 DIAGNOSIS — M47816 Spondylosis without myelopathy or radiculopathy, lumbar region: Secondary | ICD-10-CM

## 2021-12-27 DIAGNOSIS — R262 Difficulty in walking, not elsewhere classified: Secondary | ICD-10-CM

## 2021-12-27 DIAGNOSIS — M6281 Muscle weakness (generalized): Secondary | ICD-10-CM | POA: Diagnosis present

## 2021-12-27 DIAGNOSIS — R293 Abnormal posture: Secondary | ICD-10-CM

## 2021-12-27 NOTE — Therapy (Signed)
OUTPATIENT PHYSICAL THERAPY THORACOLUMBAR EVALUATION   Patient Name: Andrea Huff MRN: [id number] DOB:[birth year], 61 y.o., female Today's Date: 12/27/2021   PT End of Session - 12/27/21 0933     Visit Number 6    Date for PT Re-Evaluation 11/23/21    PT Start Time 0932    PT Stop Time 1010    PT Time Calculation (min) 38 min    Activity Tolerance Patient tolerated treatment well    Behavior During Therapy [facility] for tasks assessed/performed                 Past Medical History:  Diagnosis Date   Abnormal Pap smear    Age 60   Anxiety    Endometrial polyp 2007   Fibroid    GERD (gastroesophageal reflux disease)    H/O varicella    Hemorrhoids 10/18/06   History of measles, mumps, or rubella    Hypertension    IBS (irritable bowel syndrome)    Left lower quadrant pain 10/18/06   Nephropathy    Ovarian cyst 2010   Past Surgical History:  Procedure Laterality Date   CARDIOVASCULAR SURGERY     CHOLECYSTECTOMY     INNER EAR SURGERY     TOE AMPUTATION     TUBAL LIGATION     WISDOM TOOTH EXTRACTION     There are no problems to display for this patient.   PCP: Cathi Roan  REFERRING PROVIDER: Burnard Hawthorne  REFERRING DIAG: Diagnosis M47.816 (ICD-10-CM) - Spondylosis without myelopathy or radiculopathy, lumbar region M54.6 (ICD-10-CM) - Pain in thoracic spine  Rationale for Evaluation and Treatment: Rehabilitation  THERAPY DIAG:  Muscle weakness (generalized)  Abnormal posture  Other lack of coordination  Unsteadiness on feet  Difficulty in walking, not elsewhere classified  Lumbar spondylosis  Pain in thoracic spine  ONSET DATE: 11/23/2021  SUBJECTIVE:                                                                                                                                                                                           SUBJECTIVE STATEMENT: Patient arrives without her cast shoe. She reports that the TNS unit  seems to help, she was able to perform a few basic household activities with less pain. She didn't have to immediately sit down.  PERTINENT HISTORY:  Anxiety, neuropathy, DM, cervical DDD  PAIN:  Are you having pain? Yes: NPRS scale: 7/10 Pain location: mid to upper back Pain description: excruciating Aggravating factors: standing Relieving factors: sitting  PRECAUTIONS: None  WEIGHT BEARING RESTRICTIONS: No  FALLS:  Has patient fallen in last 6 months? No  LIVING  ENVIRONMENT: Lives with: lives alone Lives in: House/apartment Stairs:  Has a ramp to enter, has a basement, but does not go down Has following equipment at home: Single point cane and Wheelchair (manual)  OCCUPATION: N/A  PLOF: Independent with basic ADLs, Independent with gait, and Needs assistance with homemaking  PATIENT GOALS: Relieve pain to allow her to return to her normal daily activities.  NEXT MD VISIT:   OBJECTIVE:   DIAGNOSTIC FINDINGS:   Mid and lower thoracic spine disc space narrowing and spur formation without significant change. No fractures or subluxations seen. There is no swimmer's view for evaluating the cervicothoracic region. Cholecystectomy clips.  IMPRESSION: Mid and lower thoracic spine degenerative changes.  SCREENING FOR RED FLAGS: Bowel or bladder incontinence: No Spinal tumors: No Cauda equina syndrome: No Compression fracture: No Abdominal aneurysm: No  COGNITION: Overall cognitive status: Within functional limits for tasks assessed     SENSATION: Light touch: Impaired   MUSCLE LENGTH: Hamstrings: Right 40 deg; Left 36 deg  POSTURE: rounded shoulders, forward head, increased thoracic kyphosis, and flexed trunk   PALPATION: Upper traps tight B, more on R. TTP mid thoracic to low thoracic spine as well as lumbar paraspinals  LUMBAR ROM:   AROM eval  Flexion Mid Shin  Extension 80%   Right lateral flexion knee  Left lateral flexion Knee  Right rotation  80%  Left rotation 80%   (Blank rows = not tested)  LOWER EXTREMITY ROM:   Generally WFL, stiff throughout.  LOWER EXTREMITY MMT:    MMT Right eval Left eval  Hip flexion 3 3  Hip extension 3 3  Hip abduction    Hip adduction    Hip internal rotation    Hip external rotation    Knee flexion 3 3  Knee extension 3 3  Ankle dorsiflexion 3 3  Ankle plantarflexion    Ankle inversion    Ankle eversion     (Blank rows = not tested) OWESTRY: 36  FUNCTIONAL TESTS:  5 times sit to stand: 45.84 sec  GAIT: Distance walked: 40' Assistive device utilized: None Level of assistance: Modified independence Comments: Unstable, antalgic gait, slow, effortful, uses cane or furniture walking, limited distance to < 100'  TODAY'S TREATMENT:                                                                                                                              DATE:  12/27/21 Bike L3 x 6 minutes Standing shoulder ext and rows against Red Tband, 2 x 10 reps Seated shoulder ER against Red Tband 2 x 10 reps Supine clamshells against G tband x 10 reps, pelvic tilt x 10, bridge with abd resistance against green tband and pelvic tilt, 2 x 10. Seated turning to each side and walking hands out and back, 5 reps each side. Lower trunk stretch over physioball x 5   12/20/21 UBE L1 x 3 min for/3 min back Set up patient's  TNS/massage unit across lower T spine to relieve severe pain.  12/18/21 UBE L1 x 3 minutes for/3 back Seated trunk rotation/weight shifts to each side x 5 Seated rotation walkouts and back for trunk strength Seated lower trunk mobilization while holding 1# weight to stabilize upper trunk Supine bridge 2 x 10 Supine clamshells against green tband 2 x 10 reps, SLR x 10, SLR with ER x 10 each leg  12/12/21 UBE L1 x 2 min for/back Supine stretching, SKTC, DKTC, figure 4, 3 x 15 sec B Pelvic tilt-10 reps, 5 sec hold Seated HS stretch, 3 x 15 sec B  12/08/21 UBE L1 x 2 min  each NuStep L3 x 4 min  S2S with UE use, then without UE use form elevated surface HS curls green 2x10 Seated rows red 2x10   11/30/21 Education    PATIENT EDUCATION:  Education details: POC Person educated: Patient Education method: Explanation Education comprehension: verbalized understanding  HOME EXERCISE PROGRAM: [id number]  ASSESSMENT:  CLINICAL IMPRESSION: Pt reports continued pain. Her TNS unit does seem to be helping. Introduced some additional posture exercises. She will perform the exercises and determine how well her HEP is working. Discussed the possibility of the implanted TNS as recommended by her pain Dr.   Loletta Specter IMPAIRMENTS: Abnormal gait, decreased activity tolerance, decreased balance, decreased coordination, decreased endurance, decreased mobility, difficulty walking, decreased strength, increased muscle spasms, improper body mechanics, postural dysfunction, and pain.   ACTIVITY LIMITATIONS: carrying, lifting, bending, standing, squatting, sleeping, stairs, bathing, toileting, dressing, and locomotion level  PARTICIPATION LIMITATIONS: meal prep, cleaning, laundry, driving, and shopping  PERSONAL FACTORS: Past/current experiences and 1-2 comorbidities: DM, PN  are also affecting patient's functional outcome.   REHAB POTENTIAL: Good  CLINICAL DECISION MAKING: Evolving/moderate complexity  EVALUATION COMPLEXITY: High   GOALS: Goals reviewed with patient? Yes  SHORT TERM GOALS: Target date: 12/28/2021  I with basic HEP Baseline: Goal status: ongoing  LONG TERM GOALS: Target date: 02/22/2022  I with final HEP Baseline:  Goal status: INITIAL  2.  Improve 5 x STS to < 25 sec to demonstrate clinically significant improvement in strength. Baseline: 45 Goal status: ongoing  3.  Decrease Owestry score to <20 to demonstrate significant improvement in pain. Baseline: 36 Goal status: ongoing  4.  Patient will tolerate > 10 minutes of standing/walking  activities with back pain < 4/10 Baseline: 10/10 Goal status: ongoing  5.  Patient will be able to perform her normal daily activities with back pain < 4/10 Baseline: 10/10 Goal status: ongoing  PLAN:  PT FREQUENCY: 2x/week  PT DURATION: 12 weeks  PLANNED INTERVENTIONS: Therapeutic exercises, Therapeutic activity, Neuromuscular re-education, Balance training, Gait training, Patient/Family education, Self Care, Joint mobilization, Dry Needling, and Manual therapy.  PLAN FOR NEXT SESSION: Assess TNS, how did it do? **[employer] does not cover MH, CP, mech traction, Ionto, estim, vaso   Marcelina Morel, DPT 12/27/2021, 10:14 AM

## 2022-01-01 ENCOUNTER — Ambulatory Visit: Payer: Medicaid Other | Admitting: Physical Therapy

## 2022-01-03 ENCOUNTER — Ambulatory Visit: Payer: Medicaid Other | Admitting: Physical Therapy

## 2022-12-09 IMAGING — DX DG WRIST COMPLETE 3+V*R*
4 series · 4 of 4 positions shown · non-contrast
Comparison: None.

CLINICAL DATA: Pain and tenderness

EXAM:
RIGHT WRIST - COMPLETE 3+ VIEW

[wrist pa]
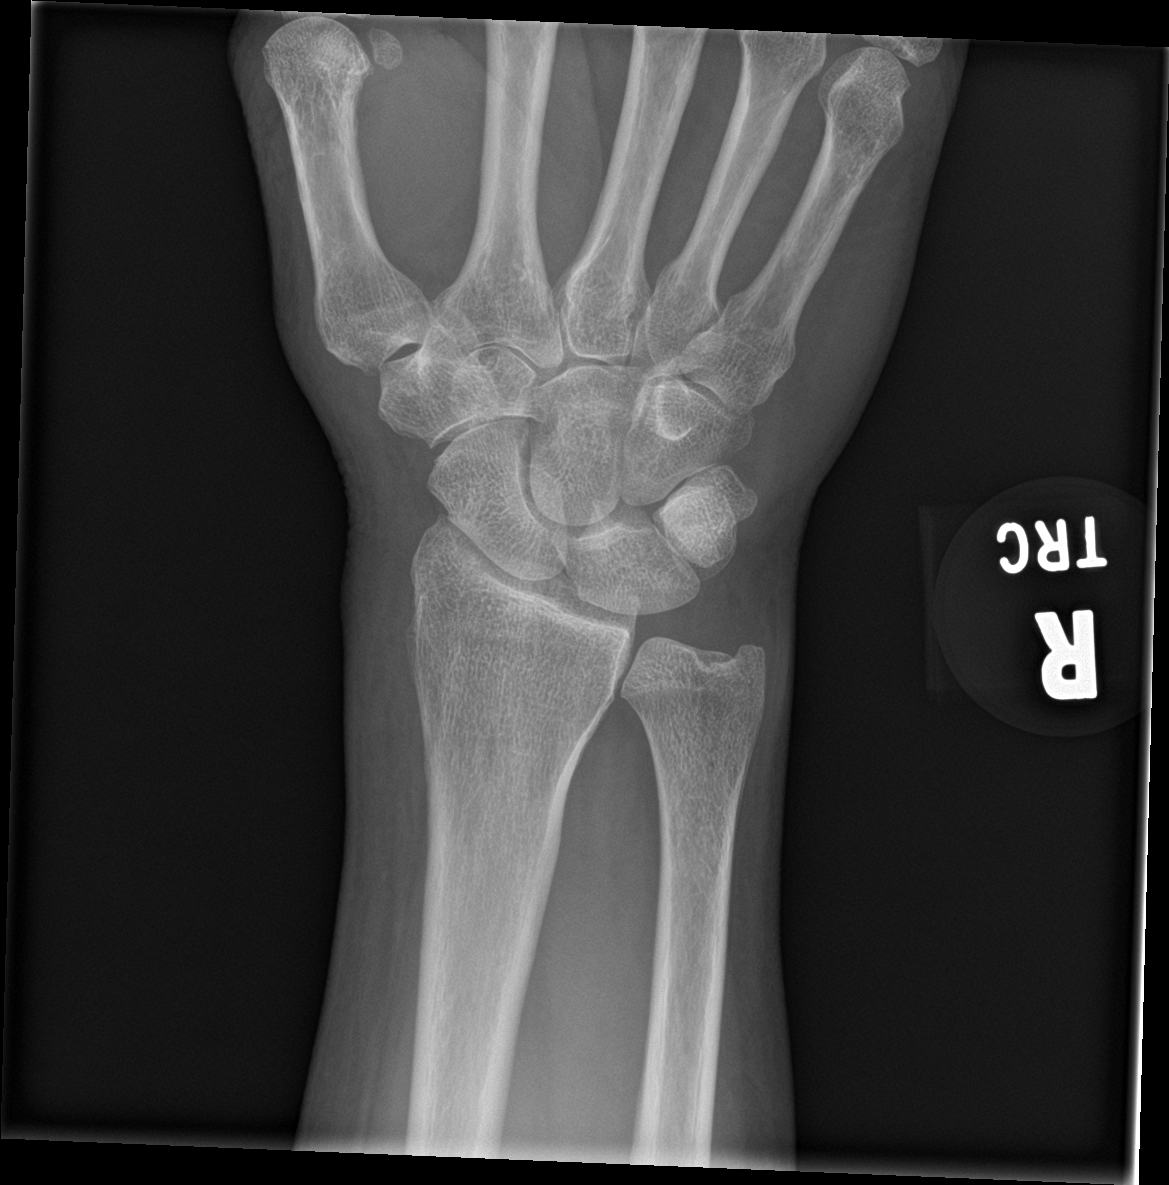

[wrist obl]
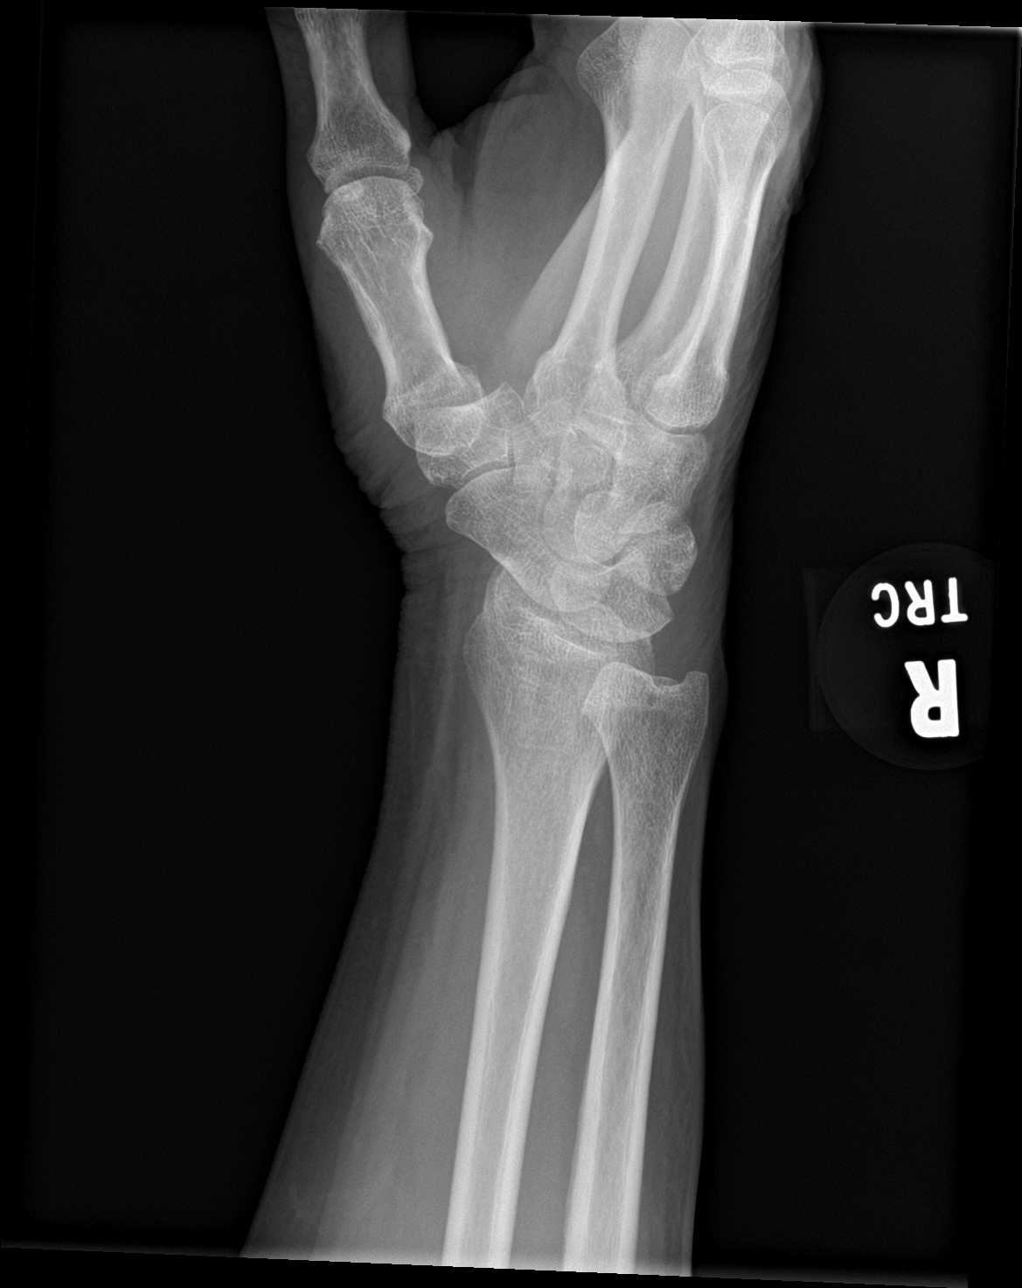

[wrist lat]
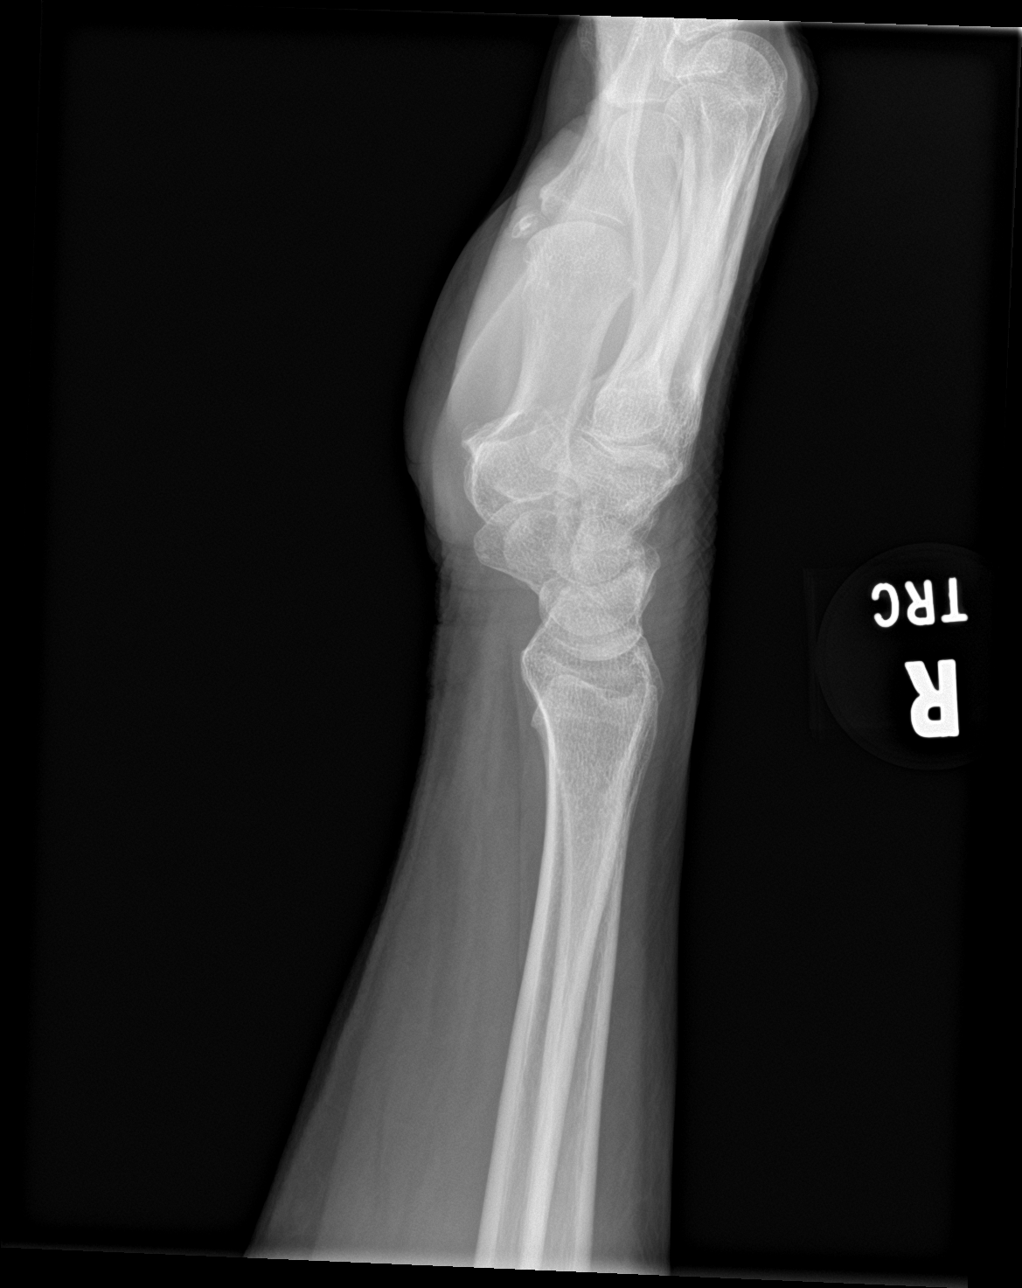

[wrist navicular]
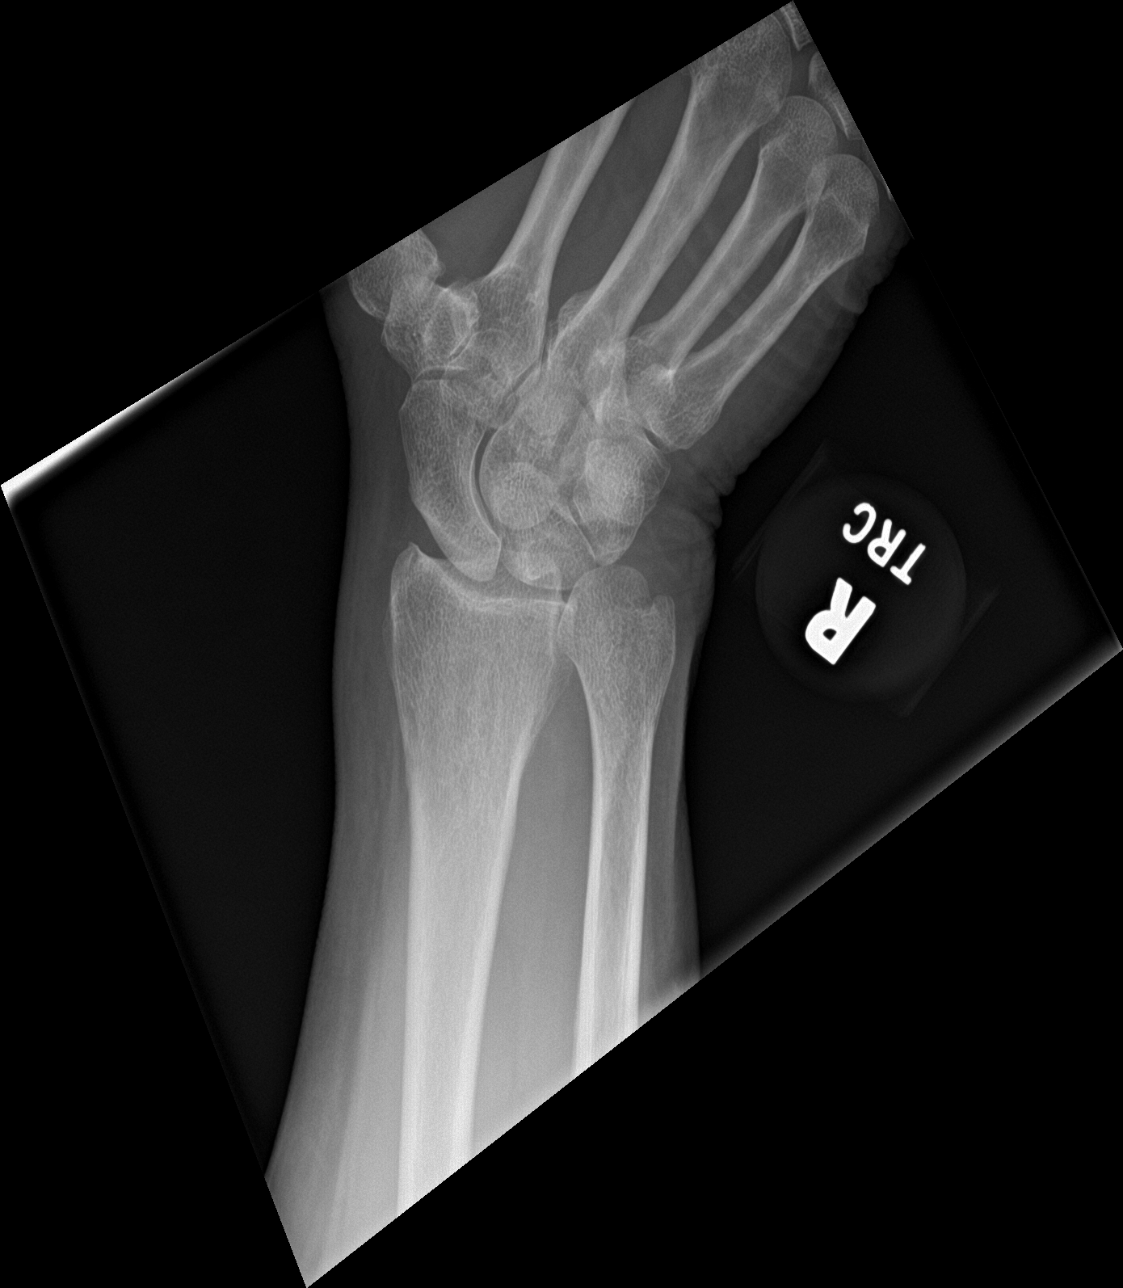

[4 of 4 positions shown; findings below may reference images not displayed]

FINDINGS: There is no evidence of fracture or dislocation. There is no
evidence of arthropathy or other focal bone abnormality. Soft
tissues are unremarkable.
IMPRESSION: Negative.

## 2023-04-30 ENCOUNTER — Encounter: Payer: Self-pay | Admitting: Physical Therapy

## 2023-04-30 ENCOUNTER — Other Ambulatory Visit: Payer: Self-pay

## 2023-04-30 ENCOUNTER — Ambulatory Visit: Attending: Orthopaedic Surgery | Admitting: Physical Therapy

## 2023-04-30 DIAGNOSIS — M6281 Muscle weakness (generalized): Secondary | ICD-10-CM | POA: Insufficient documentation

## 2023-04-30 DIAGNOSIS — M25511 Pain in right shoulder: Secondary | ICD-10-CM | POA: Insufficient documentation

## 2023-04-30 DIAGNOSIS — M25611 Stiffness of right shoulder, not elsewhere classified: Secondary | ICD-10-CM | POA: Diagnosis present

## 2023-04-30 DIAGNOSIS — R293 Abnormal posture: Secondary | ICD-10-CM | POA: Diagnosis present

## 2023-04-30 NOTE — Therapy (Signed)
 OUTPATIENT PHYSICAL THERAPY SHOULDER EVALUATION   Patient Name: Andrea Huff MRN: [id number] DOB:10-Jun-1960, 63 y.o., female Today's Date: 04/30/2023  END OF SESSION:  PT End of Session - 04/30/23 1010     Visit Number 1    Number of Visits 25    Date for PT Re-Evaluation 07/23/23    Authorization Type [employer]    Authorization Time Period 04/30/23 to 07/23/23    PT Start Time 1007    PT Stop Time 1045    PT Time Calculation (min) 38 min    Activity Tolerance Patient limited by pain    Behavior During Therapy Anxious             Past Medical History:  Diagnosis Date   Abnormal Pap smear    Age 48   Anxiety    Endometrial polyp 2007   Fibroid    GERD (gastroesophageal reflux disease)    H/O varicella    Hemorrhoids 10/18/06   History of measles, mumps, or rubella    Hypertension    IBS (irritable bowel syndrome)    Left lower quadrant pain 10/18/06   Nephropathy    Ovarian cyst 2010   Past Surgical History:  Procedure Laterality Date   CARDIOVASCULAR SURGERY     CHOLECYSTECTOMY     INNER EAR SURGERY     TOE AMPUTATION     TUBAL LIGATION     WISDOM TOOTH EXTRACTION     There are no active problems to display for this patient.   PCP: Baxter Hire   REFERRING PROVIDER: Delaine Lame, MD  REFERRING DIAG: Diagnosis M75.111 (ICD-10-CM) - Incomplete rotator cuff tear or rupture of right shoulder, not specified as traumatic M19.011 (ICD-10-CM) - Primary osteoarthritis, right shoulder M75.21 (ICD-10-CM) - Bicipital tendinitis, right shoulder  THERAPY DIAG:  Stiffness of right shoulder, not elsewhere classified  Acute pain of right shoulder  Muscle weakness (generalized)  Abnormal posture  Rationale for Evaluation and Treatment: Rehabilitation  ONSET DATE: DOS: 04/03/23 Procedure: RIGHT SHOULDER ARTHROSCOPY WITH ROTATOR CUFF REPAIR, subacromial decompression, distal clavicle excision, and Right Shouder open biceps  tenodesis  SUBJECTIVE:                                                                                                                                                                                      SUBJECTIVE STATEMENT:   Shoulder hurts all the time. Saw the PA from the surgeon and he said I can come out of the sling already but OK to wear if it hurts. I am right handed and I need to do a lot with that arm.  Not even  pain meds help. Usually ina power chair because if I weight bear I get abscesses on the bottom of my feet, this has been for the past 10 years.   Hand dominance: Right  PERTINENT HISTORY: See above   PAIN:  Are you having pain? Yes: NPRS scale: 8-9/10  Pain location: R shoulder  Pain description: bruised, beaten feeling  Aggravating factors: everything  Relieving factors: heat  PRECAUTIONS: Other: per rotator cuff repair with tenodesis protocol  RED FLAGS: None   WEIGHT BEARING RESTRICTIONS: No  FALLS:  Has patient fallen in last 6 months? Yes. Number of falls 1; face first fall on drive way, no injuries and no FOF   LIVING ENVIRONMENT: Lives with:  friend who helps  Lives in: House/apartment Stairs: No has basement but doesn't have to use it  Has following equipment at home: Single point cane, Wheelchair (power), and Ramped entry  OCCUPATION: Disability   PLOF: Independent, Independent with basic ADLs, Independent with gait, and Independent with transfers  PATIENT GOALS:get shoulder feeling better, be able to use it again   NEXT MD VISIT:   OBJECTIVE:  Note: Objective measures were completed at Evaluation unless otherwise noted.    PATIENT SURVEYS:  Quick Dash 72.7%  COGNITION: Overall cognitive status: Within functional limits for tasks assessed     SENSATION: Not tested  POSTURE: Rounded shoulders, forward head, thoracic kyphosis   UPPER EXTREMITY ROM:   Passive ROM Right eval  Shoulder flexion 90* limited by pain   Shoulder  extension   Shoulder scaption 100* limited by pain   Shoulder adduction   Shoulder internal rotation Hand to belly at 0* ABD  Shoulder external rotation 20* at zero degrees ABD  Elbow flexion   Elbow extension   Wrist flexion   Wrist extension   Wrist ulnar deviation   Wrist radial deviation   Wrist pronation   Wrist supination   (Blank rows = not tested)  UPPER EXTREMITY MMT:  MMT Right eval Left eval  Shoulder flexion    Shoulder extension    Shoulder abduction    Shoulder adduction    Shoulder internal rotation    Shoulder external rotation    Middle trapezius    Lower trapezius    Elbow flexion    Elbow extension    Wrist flexion    Wrist extension    Wrist ulnar deviation    Wrist radial deviation    Wrist pronation    Wrist supination    Grip strength (lbs)    (Blank rows = not tested)  MMT not tested at eval due to surgical precautions                                                                                                                                TREATMENT DATE:    04/30/23  Eval, POC, HEP   Practiced and discussed HEP as below    PATIENT EDUCATION: Education  details: exam findings, POC, HEP, really encouraged her to be protective of and cautious with surgical UE as it sounds like she has been using it much more than it is ready for given stage of healing  Person educated: Patient Education method: Explanation, Demonstration, Verbal cues, and Handouts Education comprehension: verbalized understanding, returned demonstration, and needs further education  HOME EXERCISE PROGRAM:  Access Code: [email or web address] URL: [email or web address] Date: 04/30/2023 Prepared by: Nedra Hai  Exercises - Seated Scapular Retraction  - 3 x daily - 7 x weekly - 1 sets - 10 reps - Seated Backward Shoulder Rolls  - 3 x daily - 7 x weekly - 1 sets - 10 reps - Flexion-Extension Shoulder Pendulum with Table Support  - 3 x daily - 7 x weekly -  1 sets - 10-20 reps - Horizontal Shoulder Pendulum with Table Support  - 3 x daily - 7 x weekly - 1 sets - 10-20 reps - Wrist AROM Flexion Extension  - 3 x daily - 7 x weekly - 1 sets - 10 reps - Wrist Circumduction AROM  - 3 x daily - 7 x weekly - 1 sets - 10 reps - Seated Gripping Towel  - 3 x daily - 7 x weekly - 1 sets - 10 reps   ASSESSMENT:  CLINICAL IMPRESSION: Patient is a 63 y.o. F who was seen today for physical therapy evaluation and treatment for : RIGHT SHOULDER ARTHROSCOPY WITH ROTATOR CUFF REPAIR, subacromial decompression, distal clavicle excision, and Right Shouder open biceps tenodesis performed on 04/03/23. Exam as expected and without complication. Of note, I'm not sure that she has been compliant with shoulder precautions- sounds like she may be doing quite a bit more than her post-op shoulder than is appropriate given still being very close to her surgical date.Encouraged her to not do quite as much around the house with her shoulder for now and also had to pull her back on exercises she had been trying on her own at home prior to coming to PT as they were a bit aggressive for this stage as well.  Will benefit from skilled PT services to guide post-op rehab and assist in return to optimal level of function.  OBJECTIVE IMPAIRMENTS: decreased ROM, decreased strength, hypomobility, increased edema, increased fascial restrictions, increased muscle spasms, impaired flexibility, impaired UE functional use, improper body mechanics, postural dysfunction, and pain.   ACTIVITY LIMITATIONS: carrying, lifting, sleeping, bed mobility, continence, bathing, toileting, dressing, reach over head, hygiene/grooming, and caring for others  PARTICIPATION LIMITATIONS: meal prep, cleaning, laundry, driving, shopping, community activity, and yard work  PERSONAL FACTORS: Behavior pattern, Education, Fitness, Past/current experiences, Social background, and Time since onset of  injury/illness/exacerbation are also affecting patient's functional outcome.   REHAB POTENTIAL: Good  CLINICAL DECISION MAKING: Stable/uncomplicated  EVALUATION COMPLEXITY: Low   GOALS: Goals reviewed with patient? Yes  SHORT TERM GOALS: Target date: 06/11/2023    Will be compliant with appropriate progressive HEP  Baseline: Goal status: INITIAL  2.  Surgical shoulder PROM to be at least 150* flexion and scaption Baseline:  Goal status: INITIAL  3.  Surgical shoulder AROM to be at least 120* flexion and scaption  Baseline:  Goal status: INITIAL  4.  Surgical shoulder IR and ER AROM to be at least 45* Baseline:  Goal status: INITIAL  5.  Will demonstrate good awareness of postural mechanics with all functional tasks  Baseline:  Goal status: INITIAL    LONG TERM GOALS: Target date: 07/23/2023  MMT to be at least 4/5 surgical UE  Baseline:  Goal status: INITIAL  2.  Pain surgical UE to be no more than 2/10 with all functional tasks  Baseline:  Goal status: INITIAL  3.  Surgical shoulder AROM to be full all planes of motion  Baseline:  Goal status: INITIAL  4.  Will be able to perform all OH reaching and functional tasks related to self and home care without increase in pain  Baseline:  Goal status: INITIAL  5.  QuickDASH to have improved by at least 15 points to show improved subjective status  Baseline:  Goal status: INITIAL    PLAN:  PT FREQUENCY: 2x/week  PT DURATION: 12 weeks  PLANNED INTERVENTIONS: 97110-Therapeutic exercises, 97530- Therapeutic activity, 97112- Neuromuscular re-education, 97535- Self Care, and 25366- Manual therapy  PLAN FOR NEXT SESSION: per protocol in context of rotator cuff repair with biceps tenodesis   Nedra Hai, PT, DPT 04/30/23 10:51 AM

## 2023-05-01 NOTE — Addendum Note (Signed)
 Addended by: Milinda Pointer on: 05/01/2023 11:55 AM   Modules accepted: Orders

## 2023-05-06 ENCOUNTER — Encounter: Payer: Self-pay | Admitting: Physical Therapy

## 2023-05-06 ENCOUNTER — Ambulatory Visit: Admitting: Physical Therapy

## 2023-05-06 DIAGNOSIS — M25511 Pain in right shoulder: Secondary | ICD-10-CM

## 2023-05-06 DIAGNOSIS — R293 Abnormal posture: Secondary | ICD-10-CM

## 2023-05-06 DIAGNOSIS — M6281 Muscle weakness (generalized): Secondary | ICD-10-CM

## 2023-05-06 DIAGNOSIS — M25611 Stiffness of right shoulder, not elsewhere classified: Secondary | ICD-10-CM

## 2023-05-06 NOTE — Therapy (Signed)
 OUTPATIENT PHYSICAL THERAPY SHOULDER EVALUATION   Patient Name: Andrea Huff MRN: [id number] DOB:11-Feb-1960, 63 y.o., female Today's Date: 05/06/2023  END OF SESSION:  PT End of Session - 05/06/23 1021     Visit Number 2    Date for PT Re-Evaluation 07/23/23    PT Start Time 1022    PT Stop Time 1100    PT Time Calculation (min) 38 min    Activity Tolerance Patient limited by pain    Behavior During Therapy Anxious             Past Medical History:  Diagnosis Date   Abnormal Pap smear    Age 10   Anxiety    Endometrial polyp 2007   Fibroid    GERD (gastroesophageal reflux disease)    H/O varicella    Hemorrhoids 10/18/06   History of measles, mumps, or rubella    Hypertension    IBS (irritable bowel syndrome)    Left lower quadrant pain 10/18/06   Nephropathy    Ovarian cyst 2010   Past Surgical History:  Procedure Laterality Date   CARDIOVASCULAR SURGERY     CHOLECYSTECTOMY     INNER EAR SURGERY     TOE AMPUTATION     TUBAL LIGATION     WISDOM TOOTH EXTRACTION     There are no active problems to display for this patient.   PCP: Baxter Hire   REFERRING PROVIDER: Delaine Lame, MD  REFERRING DIAG: Diagnosis M75.111 (ICD-10-CM) - Incomplete rotator cuff tear or rupture of right shoulder, not specified as traumatic M19.011 (ICD-10-CM) - Primary osteoarthritis, right shoulder M75.21 (ICD-10-CM) - Bicipital tendinitis, right shoulder  THERAPY DIAG:  Stiffness of right shoulder, not elsewhere classified  Acute pain of right shoulder  Muscle weakness (generalized)  Abnormal posture  Rationale for Evaluation and Treatment: Rehabilitation  ONSET DATE: DOS: 04/03/23 Procedure: RIGHT SHOULDER ARTHROSCOPY WITH ROTATOR CUFF REPAIR, subacromial decompression, distal clavicle excision, and Right Shouder open biceps tenodesis  SUBJECTIVE:                                                                                                                                                                                       SUBJECTIVE STATEMENT: "It hurts"  been using her arm some, puts sling on when it starts to hurt  Shoulder hurts all the time. Saw the PA from the surgeon and he said I can come out of the sling already but OK to wear if it hurts. I am right handed and I need to do a lot with that arm.  Not even pain meds help. Usually ina power chair because if  I weight bear I get abscesses on the bottom of my feet, this has been for the past 10 years.   Hand dominance: Right  PERTINENT HISTORY: See above   PAIN:  Are you having pain? Yes: NPRS scale: 6/10  Pain location: R shoulder  Pain description: bruised, beaten feeling  Aggravating factors: everything  Relieving factors: heat  PRECAUTIONS: Other: per rotator cuff repair with tenodesis protocol  RED FLAGS: None   WEIGHT BEARING RESTRICTIONS: No  FALLS:  Has patient fallen in last 6 months? Yes. Number of falls 1; face first fall on drive way, no injuries and no FOF   LIVING ENVIRONMENT: Lives with:  friend who helps  Lives in: House/apartment Stairs: No has basement but doesn't have to use it  Has following equipment at home: Single point cane, Wheelchair (power), and Ramped entry  OCCUPATION: Disability   PLOF: Independent, Independent with basic ADLs, Independent with gait, and Independent with transfers  PATIENT GOALS:get shoulder feeling better, be able to use it again   NEXT MD VISIT:   OBJECTIVE:  Note: Objective measures were completed at Evaluation unless otherwise noted.    PATIENT SURVEYS:  Quick Dash 72.7%  COGNITION: Overall cognitive status: Within functional limits for tasks assessed     SENSATION: Not tested  POSTURE: Rounded shoulders, forward head, thoracic kyphosis   UPPER EXTREMITY ROM:   Passive ROM Right eval  Shoulder flexion 90* limited by pain   Shoulder extension   Shoulder scaption 100* limited by pain    Shoulder adduction   Shoulder internal rotation Hand to belly at 0* ABD  Shoulder external rotation 20* at zero degrees ABD  Elbow flexion   Elbow extension   Wrist flexion   Wrist extension   Wrist ulnar deviation   Wrist radial deviation   Wrist pronation   Wrist supination   (Blank rows = not tested)  UPPER EXTREMITY MMT:  MMT Right eval Left eval  Shoulder flexion    Shoulder extension    Shoulder abduction    Shoulder adduction    Shoulder internal rotation    Shoulder external rotation    Middle trapezius    Lower trapezius    Elbow flexion    Elbow extension    Wrist flexion    Wrist extension    Wrist ulnar deviation    Wrist radial deviation    Wrist pronation    Wrist supination    Grip strength (lbs)    (Blank rows = not tested)  MMT not tested at eval due to surgical precautions                                                                                                                                TREATMENT DATE:  05/06/23 Scapular retractions 2x10 RUE PROM with end range holds   Grades 1-2 jt mobs RUE isometrics  Ext, flex, ABD, ER, IR 5''x3 two sets  04/30/23  Eval, POC, HEP   Practiced and discussed HEP as below    PATIENT EDUCATION: Education details: exam findings, POC, HEP, really encouraged her to be protective of and cautious with surgical UE as it sounds like she has been using it much more than it is ready for given stage of healing  Person educated: Patient Education method: Explanation, Demonstration, Verbal cues, and Handouts Education comprehension: verbalized understanding, returned demonstration, and needs further education  HOME EXERCISE PROGRAM:  Access Code: [email or web address] URL: [email or web address] Date: 04/30/2023 Prepared by: Terrel Ferries  Exercises - Seated Scapular Retraction  - 3 x daily - 7 x weekly - 1 sets - 10 reps - Seated Backward Shoulder Rolls  - 3 x daily - 7 x weekly - 1 sets - 10  reps - Flexion-Extension Shoulder Pendulum with Table Support  - 3 x daily - 7 x weekly - 1 sets - 10-20 reps - Horizontal Shoulder Pendulum with Table Support  - 3 x daily - 7 x weekly - 1 sets - 10-20 reps - Wrist AROM Flexion Extension  - 3 x daily - 7 x weekly - 1 sets - 10 reps - Wrist Circumduction AROM  - 3 x daily - 7 x weekly - 1 sets - 10 reps - Seated Gripping Towel  - 3 x daily - 7 x weekly - 1 sets - 10 reps   ASSESSMENT:  CLINICAL IMPRESSION: Patient is a 63 y.o. F who was seen today for physical therapy treatment for : RIGHT SHOULDER ARTHROSCOPY WITH ROTATOR CUFF REPAIR, subacromial decompression, distal clavicle excision, and Right Shouder open biceps tenodesis performed on 04/03/23. Pt enters without sling reporting the MD told her she did not have to wear it. Encouraged her to not do quite as much around the house she reports  washing the dishes ad attempting to sweep the floor both causing R shoulder pain. Session consisted of PROM and isometrics. She had good PROM.  Will benefit from skilled PT services to guide post-op rehab and assist in return to optimal level of function.  OBJECTIVE IMPAIRMENTS: decreased ROM, decreased strength, hypomobility, increased edema, increased fascial restrictions, increased muscle spasms, impaired flexibility, impaired UE functional use, improper body mechanics, postural dysfunction, and pain.   ACTIVITY LIMITATIONS: carrying, lifting, sleeping, bed mobility, continence, bathing, toileting, dressing, reach over head, hygiene/grooming, and caring for others  PARTICIPATION LIMITATIONS: meal prep, cleaning, laundry, driving, shopping, community activity, and yard work  PERSONAL FACTORS: Behavior pattern, Education, Fitness, Past/current experiences, Social background, and Time since onset of injury/illness/exacerbation are also affecting patient's functional outcome.   REHAB POTENTIAL: Good  CLINICAL DECISION MAKING:  Stable/uncomplicated  EVALUATION COMPLEXITY: Low   GOALS: Goals reviewed with patient? Yes  SHORT TERM GOALS: Target date: 06/11/2023    Will be compliant with appropriate progressive HEP  Baseline: Goal status: INITIAL  2.  Surgical shoulder PROM to be at least 150* flexion and scaption Baseline:  Goal status: INITIAL  3.  Surgical shoulder AROM to be at least 120* flexion and scaption  Baseline:  Goal status: INITIAL  4.  Surgical shoulder IR and ER AROM to be at least 45* Baseline:  Goal status: INITIAL  5.  Will demonstrate good awareness of postural mechanics with all functional tasks  Baseline:  Goal status: INITIAL    LONG TERM GOALS: Target date: 07/23/2023    MMT to be at least 4/5 surgical UE  Baseline:  Goal status: INITIAL  2.  Pain surgical UE to be no  more than 2/10 with all functional tasks  Baseline:  Goal status: INITIAL  3.  Surgical shoulder AROM to be full all planes of motion  Baseline:  Goal status: INITIAL  4.  Will be able to perform all OH reaching and functional tasks related to self and home care without increase in pain  Baseline:  Goal status: INITIAL  5.  QuickDASH to have improved by at least 15 points to show improved subjective status  Baseline:  Goal status: INITIAL    PLAN:  PT FREQUENCY: 2x/week  PT DURATION: 12 weeks  PLANNED INTERVENTIONS: 97110-Therapeutic exercises, 97530- Therapeutic activity, [id number]- Neuromuscular re-education, 97535- Self Care, and 16109- Manual therapy  PLAN FOR NEXT SESSION: per protocol in context of rotator cuff repair with biceps tenodesis   Towanda Fret, PTA 05/06/23 10:22 AM

## 2023-05-08 ENCOUNTER — Ambulatory Visit: Admitting: Physical Therapy

## 2023-05-08 ENCOUNTER — Encounter: Payer: Self-pay | Admitting: Physical Therapy

## 2023-05-08 DIAGNOSIS — M6281 Muscle weakness (generalized): Secondary | ICD-10-CM

## 2023-05-08 DIAGNOSIS — M25611 Stiffness of right shoulder, not elsewhere classified: Secondary | ICD-10-CM | POA: Diagnosis not present

## 2023-05-08 DIAGNOSIS — M25511 Pain in right shoulder: Secondary | ICD-10-CM

## 2023-05-08 DIAGNOSIS — R293 Abnormal posture: Secondary | ICD-10-CM

## 2023-05-08 NOTE — Therapy (Signed)
 OUTPATIENT PHYSICAL THERAPY SHOULDER EVALUATION   Patient Name: Andrea Huff MRN: [id number] DOB:1960/03/29, 63 y.o., female Today's Date: 05/08/2023  END OF SESSION:  PT End of Session - 05/08/23 1351     Visit Number 3    Date for PT Re-Evaluation 07/23/23    Authorization Type [employer]    PT Start Time 1351    PT Stop Time 1430    PT Time Calculation (min) 39 min    Activity Tolerance Patient limited by pain    Behavior During Therapy Anxious;WFL for tasks assessed/performed             Past Medical History:  Diagnosis Date   Abnormal Pap smear    Age 33   Anxiety    Endometrial polyp 2007   Fibroid    GERD (gastroesophageal reflux disease)    H/O varicella    Hemorrhoids 10/18/06   History of measles, mumps, or rubella    Hypertension    IBS (irritable bowel syndrome)    Left lower quadrant pain 10/18/06   Nephropathy    Ovarian cyst 2010   Past Surgical History:  Procedure Laterality Date   CARDIOVASCULAR SURGERY     CHOLECYSTECTOMY     INNER EAR SURGERY     TOE AMPUTATION     TUBAL LIGATION     WISDOM TOOTH EXTRACTION     There are no active problems to display for this patient.   PCP: [name]   REFERRING PROVIDER: Amadeo Backers, MD  REFERRING DIAG: Diagnosis M75.111 (ICD-10-CM) - Incomplete rotator cuff tear or rupture of right shoulder, not specified as traumatic M19.011 (ICD-10-CM) - Primary osteoarthritis, right shoulder M75.21 (ICD-10-CM) - Bicipital tendinitis, right shoulder  THERAPY DIAG:  Stiffness of right shoulder, not elsewhere classified  Acute pain of right shoulder  Abnormal posture  Muscle weakness (generalized)  Rationale for Evaluation and Treatment: Rehabilitation  ONSET DATE: DOS: 04/03/23 Procedure: RIGHT SHOULDER ARTHROSCOPY WITH ROTATOR CUFF REPAIR, subacromial decompression, distal clavicle excision, and Right Shouder open biceps tenodesis  SUBJECTIVE:                                                                                                                                                                                       SUBJECTIVE STATEMENT: Its been a little sore, not bad  Shoulder hurts all the time. Saw the PA from the surgeon and he said I can come out of the sling already but OK to wear if it hurts. I am right handed and I need to do a lot with that arm.  Not even pain meds help. Usually ina power chair  because if I weight bear I get abscesses on the bottom of my feet, this has been for the past 10 years.   Hand dominance: Right  PERTINENT HISTORY: See above   PAIN:  Are you having pain? Yes: NPRS scale: 5/10  Pain location: R shoulder  Pain description: bruised, beaten feeling  Aggravating factors: everything  Relieving factors: heat  PRECAUTIONS: Other: per rotator cuff repair with tenodesis protocol  RED FLAGS: None   WEIGHT BEARING RESTRICTIONS: No  FALLS:  Has patient fallen in last 6 months? Yes. Number of falls 1; face first fall on drive way, no injuries and no FOF   LIVING ENVIRONMENT: Lives with:  friend who helps  Lives in: House/apartment Stairs: No has basement but doesn't have to use it  Has following equipment at home: Single point cane, Wheelchair (power), and Ramped entry  OCCUPATION: Disability   PLOF: Independent, Independent with basic ADLs, Independent with gait, and Independent with transfers  PATIENT GOALS:get shoulder feeling better, be able to use it again   NEXT MD VISIT:   OBJECTIVE:  Note: Objective measures were completed at Evaluation unless otherwise noted.    PATIENT SURVEYS:  Quick Dash 72.7%  COGNITION: Overall cognitive status: Within functional limits for tasks assessed     SENSATION: Not tested  POSTURE: Rounded shoulders, forward head, thoracic kyphosis   UPPER EXTREMITY ROM:   Passive ROM Right eval  Shoulder flexion 90* limited by pain   Shoulder extension   Shoulder  scaption 100* limited by pain   Shoulder adduction   Shoulder internal rotation Hand to belly at 0* ABD  Shoulder external rotation 20* at zero degrees ABD  Elbow flexion   Elbow extension   Wrist flexion   Wrist extension   Wrist ulnar deviation   Wrist radial deviation   Wrist pronation   Wrist supination   (Blank rows = not tested)  UPPER EXTREMITY MMT:  MMT Right eval Left eval  Shoulder flexion    Shoulder extension    Shoulder abduction    Shoulder adduction    Shoulder internal rotation    Shoulder external rotation    Middle trapezius    Lower trapezius    Elbow flexion    Elbow extension    Wrist flexion    Wrist extension    Wrist ulnar deviation    Wrist radial deviation    Wrist pronation    Wrist supination    Grip strength (lbs)    (Blank rows = not tested)  MMT not tested at eval due to surgical precautions                                                                                                                                TREATMENT DATE:  05/08/23. AAROM cane Flex, Ext, ABD Triceps Ext 10lb 2x10 Biceps Curls 2lb 2x10 RUE PROM with end range holds   Grades 1-2 jt mobs RUE isometrics  Ext, flex, ABD, ER, IR 5''x3  Supine AAORM flexion dowel   05/06/23 Scapular retractions 2x10 RUE PROM with end range holds   Grades 1-2 jt mobs RUE isometrics  Ext, flex, ABD, ER, IR 5''x3 two sets    04/30/23  Eval, POC, HEP   Practiced and discussed HEP as below    PATIENT EDUCATION: Education details: exam findings, POC, HEP, really encouraged her to be protective of and cautious with surgical UE as it sounds like she has been using it much more than it is ready for given stage of healing  Person educated: Patient Education method: Explanation, Demonstration, Verbal cues, and Handouts Education comprehension: verbalized understanding, returned demonstration, and needs further education  HOME EXERCISE PROGRAM:  Access Code:  [email or web address] URL: [email or web address] Date: 04/30/2023 Prepared by: Terrel Ferries  Exercises - Seated Scapular Retraction  - 3 x daily - 7 x weekly - 1 sets - 10 reps - Seated Backward Shoulder Rolls  - 3 x daily - 7 x weekly - 1 sets - 10 reps - Flexion-Extension Shoulder Pendulum with Table Support  - 3 x daily - 7 x weekly - 1 sets - 10-20 reps - Horizontal Shoulder Pendulum with Table Support  - 3 x daily - 7 x weekly - 1 sets - 10-20 reps - Wrist AROM Flexion Extension  - 3 x daily - 7 x weekly - 1 sets - 10 reps - Wrist Circumduction AROM  - 3 x daily - 7 x weekly - 1 sets - 10 reps - Seated Gripping Towel  - 3 x daily - 7 x weekly - 1 sets - 10 reps   ASSESSMENT:  CLINICAL IMPRESSION: Patient is a 63 y.o. F who was seen today for physical therapy treatment for : RIGHT SHOULDER ARTHROSCOPY WITH ROTATOR CUFF REPAIR, subacromial decompression, distal clavicle excision, and Right Shouder open biceps tenodesis performed on 04/03/23. Pt enters without sling reporting the MD told her she did not have to wear it. Again encouraged her to not do quite as much around the house. Pt is 5 weeks out today initiated very light AAROM interventions, cueing pt to use LUE more.She has good overall ROM, ER being the most redistricted. Some end range pain with PROM. Will benefit from skilled PT services to guide post-op rehab and assist in return to optimal level of function.  OBJECTIVE IMPAIRMENTS: decreased ROM, decreased strength, hypomobility, increased edema, increased fascial restrictions, increased muscle spasms, impaired flexibility, impaired UE functional use, improper body mechanics, postural dysfunction, and pain.   ACTIVITY LIMITATIONS: carrying, lifting, sleeping, bed mobility, continence, bathing, toileting, dressing, reach over head, hygiene/grooming, and caring for others  PARTICIPATION LIMITATIONS: meal prep, cleaning, laundry, driving, shopping, community activity, and yard  work  PERSONAL FACTORS: Behavior pattern, Education, Fitness, Past/current experiences, Social background, and Time since onset of injury/illness/exacerbation are also affecting patient's functional outcome.   REHAB POTENTIAL: Good  CLINICAL DECISION MAKING: Stable/uncomplicated  EVALUATION COMPLEXITY: Low   GOALS: Goals reviewed with patient? Yes  SHORT TERM GOALS: Target date: 06/11/2023    Will be compliant with appropriate progressive HEP  Baseline: Goal status: Met 05/08/23  2.  Surgical shoulder PROM to be at least 150* flexion and scaption Baseline:  Goal status: INITIAL  3.  Surgical shoulder AROM to be at least 120* flexion and scaption  Baseline:  Goal status: INITIAL  4.  Surgical shoulder IR and ER AROM to be at least 45* Baseline:  Goal status: INITIAL  5.  Will demonstrate good  awareness of postural mechanics with all functional tasks  Baseline:  Goal status: INITIAL    LONG TERM GOALS: Target date: 07/23/2023    MMT to be at least 4/5 surgical UE  Baseline:  Goal status: INITIAL  2.  Pain surgical UE to be no more than 2/10 with all functional tasks  Baseline:  Goal status: INITIAL  3.  Surgical shoulder AROM to be full all planes of motion  Baseline:  Goal status: INITIAL  4.  Will be able to perform all OH reaching and functional tasks related to self and home care without increase in pain  Baseline:  Goal status: INITIAL  5.  QuickDASH to have improved by at least 15 points to show improved subjective status  Baseline:  Goal status: INITIAL    PLAN:  PT FREQUENCY: 2x/week  PT DURATION: 12 weeks  PLANNED INTERVENTIONS: 97110-Therapeutic exercises, 97530- Therapeutic activity, [id number]- Neuromuscular re-education, 97535- Self Care, and 16109- Manual therapy  PLAN FOR NEXT SESSION: per protocol in context of rotator cuff repair with biceps tenodesis   Towanda Fret, PTA 05/08/23 1:51 PM

## 2023-05-13 ENCOUNTER — Ambulatory Visit: Admitting: Physical Therapy

## 2023-05-13 ENCOUNTER — Encounter: Payer: Self-pay | Admitting: Physical Therapy

## 2023-05-13 DIAGNOSIS — M25511 Pain in right shoulder: Secondary | ICD-10-CM

## 2023-05-13 DIAGNOSIS — R293 Abnormal posture: Secondary | ICD-10-CM

## 2023-05-13 DIAGNOSIS — M25611 Stiffness of right shoulder, not elsewhere classified: Secondary | ICD-10-CM

## 2023-05-13 DIAGNOSIS — M6281 Muscle weakness (generalized): Secondary | ICD-10-CM

## 2023-05-13 NOTE — Therapy (Signed)
 OUTPATIENT PHYSICAL THERAPY SHOULDER EVALUATION   Patient Name: Andrea Huff MRN: [id number] DOB:02-06-1960, 63 y.o., female Today's Date: 05/13/2023  END OF SESSION:  PT End of Session - 05/13/23 1019     Visit Number 4    Number of Visits 25    Date for PT Re-Evaluation 07/23/23    Authorization Type [employer]    Authorization Time Period 04/30/23 to 07/23/23    PT Start Time 1019    PT Stop Time 1100    PT Time Calculation (min) 41 min             Past Medical History:  Diagnosis Date   Abnormal Pap smear    Age 63   Anxiety    Endometrial polyp 2007   Fibroid    GERD (gastroesophageal reflux disease)    H/O varicella    Hemorrhoids 10/18/06   History of measles, mumps, or rubella    Hypertension    IBS (irritable bowel syndrome)    Left lower quadrant pain 10/18/06   Nephropathy    Ovarian cyst 2010   Past Surgical History:  Procedure Laterality Date   CARDIOVASCULAR SURGERY     CHOLECYSTECTOMY     INNER EAR SURGERY     TOE AMPUTATION     TUBAL LIGATION     WISDOM TOOTH EXTRACTION     There are no active problems to display for this patient.   PCP: [name]   REFERRING PROVIDER: Amadeo Backers, MD  REFERRING DIAG: Diagnosis M75.111 (ICD-10-CM) - Incomplete rotator cuff tear or rupture of right shoulder, not specified as traumatic M19.011 (ICD-10-CM) - Primary osteoarthritis, right shoulder M75.21 (ICD-10-CM) - Bicipital tendinitis, right shoulder  THERAPY DIAG:  Stiffness of right shoulder, not elsewhere classified  Acute pain of right shoulder  Abnormal posture  Muscle weakness (generalized)  Rationale for Evaluation and Treatment: Rehabilitation  ONSET DATE: DOS: 04/03/23 Procedure: RIGHT SHOULDER ARTHROSCOPY WITH ROTATOR CUFF REPAIR, subacromial decompression, distal clavicle excision, and Right Shouder open biceps tenodesis  SUBJECTIVE:                                                                                                                                                                                       SUBJECTIVE STATEMENT: "I'm tired". She did a lot of cooking yesterday but her shoulder feels ok.   Shoulder hurts all the time. Saw the PA from the surgeon and he said I can come out of the sling already but OK to wear if it hurts. I am right handed and I need to do a lot with that arm.  Not even pain meds  help. Usually ina power chair because if I weight bear I get abscesses on the bottom of my feet, this has been for the past 10 years.   Hand dominance: Right  PERTINENT HISTORY: See above   PAIN:  Are you having pain? Yes: NPRS scale: 5/10  Pain location: R shoulder  Pain description: bruised, beaten feeling  Aggravating factors: everything  Relieving factors: heat  PRECAUTIONS: Other: per rotator cuff repair with tenodesis protocol  RED FLAGS: None   WEIGHT BEARING RESTRICTIONS: No  FALLS:  Has patient fallen in last 6 months? Yes. Number of falls 1; face first fall on drive way, no injuries and no FOF   LIVING ENVIRONMENT: Lives with:  friend who helps  Lives in: House/apartment Stairs: No has basement but doesn't have to use it  Has following equipment at home: Single point cane, Wheelchair (power), and Ramped entry  OCCUPATION: Disability   PLOF: Independent, Independent with basic ADLs, Independent with gait, and Independent with transfers  PATIENT GOALS:get shoulder feeling better, be able to use it again   NEXT MD VISIT:   OBJECTIVE:  Note: Objective measures were completed at Evaluation unless otherwise noted.    PATIENT SURVEYS:  Quick Dash 72.7%  COGNITION: Overall cognitive status: Within functional limits for tasks assessed     SENSATION: Not tested  POSTURE: Rounded shoulders, forward head, thoracic kyphosis   UPPER EXTREMITY ROM:   Passive ROM Right eval  Shoulder flexion 90* limited by pain   Shoulder extension   Shoulder  scaption 100* limited by pain   Shoulder adduction   Shoulder internal rotation Hand to belly at 0* ABD  Shoulder external rotation 20* at zero degrees ABD  Elbow flexion   Elbow extension   Wrist flexion   Wrist extension   Wrist ulnar deviation   Wrist radial deviation   Wrist pronation   Wrist supination   (Blank rows = not tested)  UPPER EXTREMITY MMT:  MMT Right eval Left eval  Shoulder flexion    Shoulder extension    Shoulder abduction    Shoulder adduction    Shoulder internal rotation    Shoulder external rotation    Middle trapezius    Lower trapezius    Elbow flexion    Elbow extension    Wrist flexion    Wrist extension    Wrist ulnar deviation    Wrist radial deviation    Wrist pronation    Wrist supination    Grip strength (lbs)    (Blank rows = not tested)  MMT not tested at eval due to surgical precautions                                                                                                                                TREATMENT DATE:  05/13/23 AAROM cane flex, abd, ext RUE PROM    Grade 1-2 Jt mobs RUE isometrics flex, ext, abt, IR, ER x2  each     05/08/23. AAROM cane Flex, Ext, ABD Triceps Ext 10lb 2x10 Biceps Curls 2lb 2x10 RUE PROM with end range holds   Grades 1-2 jt mobs RUE isometrics  Ext, flex, ABD, ER, IR 5''x3  Supine AAORM flexion dowel   05/06/23 Scapular retractions 2x10 RUE PROM with end range holds   Grades 1-2 jt mobs RUE isometrics  Ext, flex, ABD, ER, IR 5''x3 two sets    04/30/23  Eval, POC, HEP   Practiced and discussed HEP as below    PATIENT EDUCATION: Education details: exam findings, POC, HEP, really encouraged her to be protective of and cautious with surgical UE as it sounds like she has been using it much more than it is ready for given stage of healing  Person educated: Patient Education method: Explanation, Demonstration, Verbal cues, and Handouts Education comprehension: verbalized  understanding, returned demonstration, and needs further education  HOME EXERCISE PROGRAM:  Access Code: [email or web address] URL: [email or web address] Date: 04/30/2023 Prepared by: Terrel Ferries  Exercises - Seated Scapular Retraction  - 3 x daily - 7 x weekly - 1 sets - 10 reps - Seated Backward Shoulder Rolls  - 3 x daily - 7 x weekly - 1 sets - 10 reps - Flexion-Extension Shoulder Pendulum with Table Support  - 3 x daily - 7 x weekly - 1 sets - 10-20 reps - Horizontal Shoulder Pendulum with Table Support  - 3 x daily - 7 x weekly - 1 sets - 10-20 reps - Wrist AROM Flexion Extension  - 3 x daily - 7 x weekly - 1 sets - 10 reps - Wrist Circumduction AROM  - 3 x daily - 7 x weekly - 1 sets - 10 reps - Seated Gripping Towel  - 3 x daily - 7 x weekly - 1 sets - 10 reps   ASSESSMENT:  CLINICAL IMPRESSION: She arrived tired from the weekend. She tolerated AAROM with the cane well, but had some pain abd and flex past 90*. Jt mobs were done to loosen the joint capsule and she was able to get more PROM with abd. I educated her on why she is having pain with abd, flex, and ER. She was encouraged not to do too much too fast, pt mentioned combing her hair and haven pain, protocol only allows AAROM at this point.  Goals were assessed .   Patient is a 63 y.o. F who was seen today for physical therapy treatment for : RIGHT SHOULDER ARTHROSCOPY WITH ROTATOR CUFF REPAIR, subacromial decompression, distal clavicle excision, and Right Shouder open biceps tenodesis performed on 04/03/23. Pt enters without sling reporting the MD told her she did not have to wear it. Again encouraged her to not do quite as much around the house. Pt is 5 weeks out today initiated very light AAROM interventions, cueing pt to use LUE more.She has good overall ROM, ER being the most redistricted. Some end range pain with PROM. Will benefit from skilled PT services to guide post-op rehab and assist in return to optimal level of  function.  OBJECTIVE IMPAIRMENTS: decreased ROM, decreased strength, hypomobility, increased edema, increased fascial restrictions, increased muscle spasms, impaired flexibility, impaired UE functional use, improper body mechanics, postural dysfunction, and pain.   ACTIVITY LIMITATIONS: carrying, lifting, sleeping, bed mobility, continence, bathing, toileting, dressing, reach over head, hygiene/grooming, and caring for others  PARTICIPATION LIMITATIONS: meal prep, cleaning, laundry, driving, shopping, community activity, and yard work  PERSONAL FACTORS: Behavior pattern, Education, Fitness, Past/current experiences, Social background,  and Time since onset of injury/illness/exacerbation are also affecting patient's functional outcome.   REHAB POTENTIAL: Good  CLINICAL DECISION MAKING: Stable/uncomplicated  EVALUATION COMPLEXITY: Low   GOALS: Goals reviewed with patient? Yes  SHORT TERM GOALS: Target date: 06/11/2023    Will be compliant with appropriate progressive HEP  Baseline: Goal status: Met 05/08/23  2.  Surgical shoulder PROM to be at least 150* flexion and scaption Baseline:  Goal status: 05/13/23 155 flex, 143 scaption Partly met  3.  Surgical shoulder AROM to be at least 120* flexion and scaption  Baseline:  Goal status: INITIAL  4.  Surgical shoulder IR and ER AROM to be at least 45* Baseline:  Goal status: INITIAL  5.  Will demonstrate good awareness of postural mechanics with all functional tasks  Baseline:  Goal status: INITIAL    LONG TERM GOALS: Target date: 07/23/2023    MMT to be at least 4/5 surgical UE  Baseline:  Goal status: INITIAL  2.  Pain surgical UE to be no more than 2/10 with all functional tasks  Baseline:  Goal status: INITIAL  3.  Surgical shoulder AROM to be full all planes of motion  Baseline:  Goal status: INITIAL  4.  Will be able to perform all OH reaching and functional tasks related to self and home care without increase in  pain  Baseline:  Goal status: INITIAL  5.  QuickDASH to have improved by at least 15 points to show improved subjective status  Baseline:  Goal status: INITIAL    PLAN:  PT FREQUENCY: 2x/week  PT DURATION: 12 weeks  PLANNED INTERVENTIONS: 97110-Therapeutic exercises, 97530- Therapeutic activity, 97112- Neuromuscular re-education, 97535- Self Care, and 29562- Manual therapy  PLAN FOR NEXT SESSION: per protocol in context of rotator cuff repair with biceps tenodesis     Laurelyn Ponder, SPTA 05/13/2023, 10:20 AM   [facility] [address] [facility]. [place], [place], [address] Phone: [phone]   Fax:  [phone]

## 2023-05-15 ENCOUNTER — Ambulatory Visit: Admitting: Physical Therapy

## 2023-05-15 ENCOUNTER — Encounter: Payer: Self-pay | Admitting: Physical Therapy

## 2023-05-15 DIAGNOSIS — M25611 Stiffness of right shoulder, not elsewhere classified: Secondary | ICD-10-CM | POA: Diagnosis not present

## 2023-05-15 DIAGNOSIS — M6281 Muscle weakness (generalized): Secondary | ICD-10-CM

## 2023-05-15 DIAGNOSIS — M25511 Pain in right shoulder: Secondary | ICD-10-CM

## 2023-05-15 DIAGNOSIS — R293 Abnormal posture: Secondary | ICD-10-CM

## 2023-05-15 NOTE — Therapy (Signed)
 OUTPATIENT PHYSICAL THERAPY SHOULDER TREATMENT    Patient Name: Andrea Huff MRN: [id number] DOB:09-04-1960, 63 y.o., female Today's Date: 05/15/2023  END OF SESSION:  PT End of Session - 05/15/23 1222     Visit Number 5    Number of Visits 25    Date for PT Re-Evaluation 07/23/23    Authorization Type [employer]    Authorization Time Period 04/30/23 to 07/23/23    PT Start Time 1155   pt late   PT Stop Time 1225    PT Time Calculation (min) 30 min    Activity Tolerance Patient tolerated treatment well    Behavior During Therapy Anxious;WFL for tasks assessed/performed              Past Medical History:  Diagnosis Date   Abnormal Pap smear    Age 34   Anxiety    Endometrial polyp 2007   Fibroid    GERD (gastroesophageal reflux disease)    H/O varicella    Hemorrhoids 10/18/06   History of measles, mumps, or rubella    Hypertension    IBS (irritable bowel syndrome)    Left lower quadrant pain 10/18/06   Nephropathy    Ovarian cyst 2010   Past Surgical History:  Procedure Laterality Date   CARDIOVASCULAR SURGERY     CHOLECYSTECTOMY     INNER EAR SURGERY     TOE AMPUTATION     TUBAL LIGATION     WISDOM TOOTH EXTRACTION     There are no active problems to display for this patient.   PCP: Andrea Huff   REFERRING PROVIDER: Amadeo Backers, MD  REFERRING DIAG: Diagnosis M75.111 (ICD-10-CM) - Incomplete rotator cuff tear or rupture of right shoulder, not specified as traumatic M19.011 (ICD-10-CM) - Primary osteoarthritis, right shoulder M75.21 (ICD-10-CM) - Bicipital tendinitis, right shoulder  THERAPY DIAG:  Stiffness of right shoulder, not elsewhere classified  Acute pain of right shoulder  Muscle weakness (generalized)  Abnormal posture  Rationale for Evaluation and Treatment: Rehabilitation  ONSET DATE: DOS: 04/03/23 Procedure: RIGHT SHOULDER ARTHROSCOPY WITH ROTATOR CUFF REPAIR, subacromial decompression, distal  clavicle excision, and Right Shouder open biceps tenodesis  SUBJECTIVE:                                                                                                                                                                                      SUBJECTIVE STATEMENT:  Ugh its Wednesday. Shoulder is still sore in the front, it feels like a bruise. I washed dishes, I wiped down the stove, it hurts when I do these things. I think I'm seeing Andrea Huff on the 28th.  EVAL: Shoulder hurts all the time. Saw the PA from the surgeon and he said I can come out of the sling already but OK to wear if it hurts. I am right handed and I need to do a lot with that arm.  Not even pain meds help. Usually ina power chair because if I weight bear I get abscesses on the bottom of my feet, this has been for the past 10 years.   Hand dominance: Right  PERTINENT HISTORY: See above   PAIN:  Are you having pain? Yes: NPRS scale: 5/10  Pain location: R shoulder especially in the front   Pain description: bruised, beaten feeling  Aggravating factors: everything/unsure   Relieving factors: nothing   PRECAUTIONS: Other: per rotator cuff repair with tenodesis protocol  RED FLAGS: None   WEIGHT BEARING RESTRICTIONS: No  FALLS:  Has patient fallen in last 6 months? Yes. Number of falls 1; face first fall on drive way, no injuries and no FOF   LIVING ENVIRONMENT: Lives with:  friend who helps  Lives in: House/apartment Stairs: No has basement but doesn't have to use it  Has following equipment at home: Single point cane, Wheelchair (power), and Ramped entry  OCCUPATION: Disability   PLOF: Independent, Independent with basic ADLs, Independent with gait, and Independent with transfers  PATIENT GOALS:get shoulder feeling better, be able to use it again   NEXT MD VISIT:   OBJECTIVE:  Note: Objective measures were completed at Evaluation unless otherwise noted.    PATIENT SURVEYS:  Quick Dash  72.7%  COGNITION: Overall cognitive status: Within functional limits for tasks assessed     SENSATION: Not tested  POSTURE: Rounded shoulders, forward head, thoracic kyphosis   UPPER EXTREMITY ROM:   Passive ROM Right eval Right 05/15/23  Shoulder flexion 90* limited by pain  140* before pain started   Shoulder extension    Shoulder scaption 100* limited by pain  120* scaption   Shoulder adduction    Shoulder internal rotation Hand to belly at 0* ABD   Shoulder external rotation 20* at zero degrees ABD 15-20* at zero degrees ABD   Elbow flexion    Elbow extension    Wrist flexion    Wrist extension    Wrist ulnar deviation    Wrist radial deviation    Wrist pronation    Wrist supination    (Blank rows = not tested)  UPPER EXTREMITY MMT:  MMT Right eval Left eval  Shoulder flexion    Shoulder extension    Shoulder abduction    Shoulder adduction    Shoulder internal rotation    Shoulder external rotation    Middle trapezius    Lower trapezius    Elbow flexion    Elbow extension    Wrist flexion    Wrist extension    Wrist ulnar deviation    Wrist radial deviation    Wrist pronation    Wrist supination    Grip strength (lbs)    (Blank rows = not tested)  MMT not tested at eval due to surgical precautions  TREATMENT DATE:    05/15/23  PROM all directions surgical UE AAROM for flexion, supine with dowel x12  AAROM ER with dowel, seated x12   Isometrics flexion/ABD/extension/ER 12x3 seconds each      05/13/23 AAROM cane flex, abd, ext RUE PROM    Grade 1-2 Jt mobs RUE isometrics flex, ext, abt, IR, ER x2 each     05/08/23. AAROM cane Flex, Ext, ABD Triceps Ext 10lb 2x10 Biceps Curls 2lb 2x10 RUE PROM with end range holds   Grades 1-2 jt mobs RUE isometrics  Ext, flex, ABD, ER, IR 5''x3  Supine AAORM flexion  dowel   05/06/23 Scapular retractions 2x10 RUE PROM with end range holds   Grades 1-2 jt mobs RUE isometrics  Ext, flex, ABD, ER, IR 5''x3 two sets    04/30/23  Eval, POC, HEP   Practiced and discussed HEP as below    PATIENT EDUCATION: Education details: exam findings, POC, HEP, really encouraged her to be protective of and cautious with surgical UE as it sounds like she has been using it much more than it is ready for given stage of healing  Person educated: Patient Education method: Explanation, Demonstration, Verbal cues, and Handouts Education comprehension: verbalized understanding, returned demonstration, and needs further education  HOME EXERCISE PROGRAM:  Access Code: [email or web address] URL: [email or web address] Date: 04/30/2023 Prepared by: Terrel Ferries  Exercises - Seated Scapular Retraction  - 3 x daily - 7 x weekly - 1 sets - 10 reps - Seated Backward Shoulder Rolls  - 3 x daily - 7 x weekly - 1 sets - 10 reps - Flexion-Extension Shoulder Pendulum with Table Support  - 3 x daily - 7 x weekly - 1 sets - 10-20 reps - Horizontal Shoulder Pendulum with Table Support  - 3 x daily - 7 x weekly - 1 sets - 10-20 reps - Wrist AROM Flexion Extension  - 3 x daily - 7 x weekly - 1 sets - 10 reps - Wrist Circumduction AROM  - 3 x daily - 7 x weekly - 1 sets - 10 reps - Seated Gripping Towel  - 3 x daily - 7 x weekly - 1 sets - 10 reps   ASSESSMENT:  CLINICAL IMPRESSION:  Pt arrives today doing OK, continued to encourage her to go easy at home and not over-use surgical UE with household tasks just yet- she is doing a lot with it at home, perhaps a bit too quickly. Kept working on PROM and AAROM as well as isometrics  today- she is 6 weeks out and should be able to progress to more of an AROM and light resistance focus soon. Sees the MD on the 28th.   Patient is a 63 y.o. F who was seen today for physical therapy treatment for : RIGHT SHOULDER ARTHROSCOPY WITH ROTATOR  CUFF REPAIR, subacromial decompression, distal clavicle excision, and Right Shouder open biceps tenodesis performed on 04/03/23. Pt enters without sling reporting the MD told her she did not have to wear it. Again encouraged her to not do quite as much around the house. Pt is 5 weeks out today initiated very light AAROM interventions, cueing pt to use LUE more.She has good overall ROM, ER being the most redistricted. Some end range pain with PROM. Will benefit from skilled PT services to guide post-op rehab and assist in return to optimal level of function.  OBJECTIVE IMPAIRMENTS: decreased ROM, decreased strength, hypomobility, increased edema, increased fascial restrictions, increased muscle spasms, impaired flexibility, impaired UE functional  use, improper body mechanics, postural dysfunction, and pain.   ACTIVITY LIMITATIONS: carrying, lifting, sleeping, bed mobility, continence, bathing, toileting, dressing, reach over head, hygiene/grooming, and caring for others  PARTICIPATION LIMITATIONS: meal prep, cleaning, laundry, driving, shopping, community activity, and yard work  PERSONAL FACTORS: Behavior pattern, Education, Fitness, Past/current experiences, Social background, and Time since onset of injury/illness/exacerbation are also affecting patient's functional outcome.   REHAB POTENTIAL: Good  CLINICAL DECISION MAKING: Stable/uncomplicated  EVALUATION COMPLEXITY: Low   GOALS: Goals reviewed with patient? Yes  SHORT TERM GOALS: Target date: 06/11/2023    Will be compliant with appropriate progressive HEP  Baseline: Goal status: Met 05/08/23  2.  Surgical shoulder PROM to be at least 150* flexion and scaption Baseline:  Goal status: 05/13/23 155 flex, 143 scaption Partly met  3.  Surgical shoulder AROM to be at least 120* flexion and scaption  Baseline:  Goal status: INITIAL  4.  Surgical shoulder IR and ER AROM to be at least 45* Baseline:  Goal status: INITIAL  5.  Will  demonstrate good awareness of postural mechanics with all functional tasks  Baseline:  Goal status: INITIAL    LONG TERM GOALS: Target date: 07/23/2023    MMT to be at least 4/5 surgical UE  Baseline:  Goal status: INITIAL  2.  Pain surgical UE to be no more than 2/10 with all functional tasks  Baseline:  Goal status: INITIAL  3.  Surgical shoulder AROM to be full all planes of motion  Baseline:  Goal status: INITIAL  4.  Will be able to perform all OH reaching and functional tasks related to self and home care without increase in pain  Baseline:  Goal status: INITIAL  5.  QuickDASH to have improved by at least 15 points to show improved subjective status  Baseline:  Goal status: INITIAL    PLAN:  PT FREQUENCY: 2x/week  PT DURATION: 12 weeks  PLANNED INTERVENTIONS: 97110-Therapeutic exercises, 97530- Therapeutic activity, 97112- Neuromuscular re-education, 97535- Self Care, and 86578- Manual therapy  PLAN FOR NEXT SESSION: per protocol in context of rotator cuff repair with biceps tenodesis; 6 weeks as of 05/15/23, start more AROM and potentially strength as protocol allows    Terrel Ferries, PT, DPT 05/15/23 12:30 PM   [facility] [address] [place], [place], [address] Phone: [phone]   Fax:  [phone]

## 2023-05-20 ENCOUNTER — Ambulatory Visit: Admitting: Physical Therapy

## 2023-05-22 ENCOUNTER — Encounter: Payer: Self-pay | Admitting: Physical Therapy

## 2023-05-22 ENCOUNTER — Ambulatory Visit: Admitting: Physical Therapy

## 2023-05-22 DIAGNOSIS — R293 Abnormal posture: Secondary | ICD-10-CM

## 2023-05-22 DIAGNOSIS — M6281 Muscle weakness (generalized): Secondary | ICD-10-CM

## 2023-05-22 DIAGNOSIS — M25611 Stiffness of right shoulder, not elsewhere classified: Secondary | ICD-10-CM | POA: Diagnosis not present

## 2023-05-22 DIAGNOSIS — M25511 Pain in right shoulder: Secondary | ICD-10-CM

## 2023-05-22 NOTE — Therapy (Signed)
 OUTPATIENT PHYSICAL THERAPY SHOULDER TREATMENT    Patient Name: Andrea Huff MRN: [id number] DOB:1960-08-01, 63 y.o., female Today's Date: 05/22/2023  END OF SESSION:  PT End of Session - 05/22/23 1141     Visit Number 6    Number of Visits 25    Date for PT Re-Evaluation 07/23/23    Authorization Type [employer]    Authorization Time Period 04/30/23 to 07/23/23    PT Start Time 1101    PT Stop Time 1143    PT Time Calculation (min) 42 min    Activity Tolerance Patient tolerated treatment well    Behavior During Therapy [facility] for tasks assessed/performed               Past Medical History:  Diagnosis Date   Abnormal Pap smear    Age 13   Anxiety    Endometrial polyp 2007   Fibroid    GERD (gastroesophageal reflux disease)    H/O varicella    Hemorrhoids 10/18/06   History of measles, mumps, or rubella    Hypertension    IBS (irritable bowel syndrome)    Left lower quadrant pain 10/18/06   Nephropathy    Ovarian cyst 2010   Past Surgical History:  Procedure Laterality Date   CARDIOVASCULAR SURGERY     CHOLECYSTECTOMY     INNER EAR SURGERY     TOE AMPUTATION     TUBAL LIGATION     WISDOM TOOTH EXTRACTION     There are no active problems to display for this patient.   PCP: [name]   REFERRING PROVIDER: Amadeo Backers, MD  REFERRING DIAG: Diagnosis M75.111 (ICD-10-CM) - Incomplete rotator cuff tear or rupture of right shoulder, not specified as traumatic M19.011 (ICD-10-CM) - Primary osteoarthritis, right shoulder M75.21 (ICD-10-CM) - Bicipital tendinitis, right shoulder  THERAPY DIAG:  Stiffness of right shoulder, not elsewhere classified  Acute pain of right shoulder  Muscle weakness (generalized)  Abnormal posture  Rationale for Evaluation and Treatment: Rehabilitation  ONSET DATE: DOS: 04/03/23 Procedure: RIGHT SHOULDER ARTHROSCOPY WITH ROTATOR CUFF REPAIR, subacromial decompression, distal clavicle excision,  and Right Shouder open biceps tenodesis  SUBJECTIVE:                                                                                                                                                                                      SUBJECTIVE STATEMENT:  Saw the MD on Monday, he said to wait until May 5th to start strengthening, he was happy with how the shoulder is doing overall    EVAL: Shoulder hurts all the time. Saw the PA from the surgeon and  he said I can come out of the sling already but OK to wear if it hurts. I am right handed and I need to do a lot with that arm.  Not even pain meds help. Usually ina power chair because if I weight bear I get abscesses on the bottom of my feet, this has been for the past 10 years.   Hand dominance: Right  PERTINENT HISTORY: See above   PAIN:  Are you having pain? No 0/10 now, at worst over past week got up to 8/10 especially at night   PRECAUTIONS: Other: per rotator cuff repair with tenodesis protocol  RED FLAGS: None   WEIGHT BEARING RESTRICTIONS: No  FALLS:  Has patient fallen in last 6 months? Yes. Number of falls 1; face first fall on drive way, no injuries and no FOF   LIVING ENVIRONMENT: Lives with:  friend who helps  Lives in: House/apartment Stairs: No has basement but doesn't have to use it  Has following equipment at home: Single point cane, Wheelchair (power), and Ramped entry  OCCUPATION: Disability   PLOF: Independent, Independent with basic ADLs, Independent with gait, and Independent with transfers  PATIENT GOALS:get shoulder feeling better, be able to use it again   NEXT MD VISIT:   OBJECTIVE:  Note: Objective measures were completed at Evaluation unless otherwise noted.    PATIENT SURVEYS:  Quick Dash 72.7%  COGNITION: Overall cognitive status: Within functional limits for tasks assessed     SENSATION: Not tested  POSTURE: Rounded shoulders, forward head, thoracic kyphosis   UPPER EXTREMITY  ROM:   Passive ROM Right eval Right 05/15/23  Shoulder flexion 90* limited by pain  140* before pain started   Shoulder extension    Shoulder scaption 100* limited by pain  120* scaption   Shoulder adduction    Shoulder internal rotation Hand to belly at 0* ABD   Shoulder external rotation 20* at zero degrees ABD 15-20* at zero degrees ABD   Elbow flexion    Elbow extension    Wrist flexion    Wrist extension    Wrist ulnar deviation    Wrist radial deviation    Wrist pronation    Wrist supination    (Blank rows = not tested)  UPPER EXTREMITY MMT:  MMT Right eval Left eval  Shoulder flexion    Shoulder extension    Shoulder abduction    Shoulder adduction    Shoulder internal rotation    Shoulder external rotation    Middle trapezius    Lower trapezius    Elbow flexion    Elbow extension    Wrist flexion    Wrist extension    Wrist ulnar deviation    Wrist radial deviation    Wrist pronation    Wrist supination    Grip strength (lbs)    (Blank rows = not tested)  MMT not tested at eval due to surgical precautions  TREATMENT DATE:   05/22/23  PROM/stretching R shoulder as appropriate  Shoulder flexion AAROM 1# rod x12  Chest presses 1# rod x12 Shoulder scaption AAROM 1# x15  Supine flexion AROM 0# x10  Sidelying ABD AROM 0# x10  Seated ER AROM towel pinched to side x15 available ROM   STM R upper trap, middle and anterior delt with good tissue release noted       05/15/23  PROM all directions surgical UE AAROM for flexion, supine with dowel x12  AAROM ER with dowel, seated x12   Isometrics flexion/ABD/extension/ER 12x3 seconds each      05/13/23 AAROM cane flex, abd, ext RUE PROM    Grade 1-2 Jt mobs RUE isometrics flex, ext, abt, IR, ER x2 each     05/08/23. AAROM cane Flex, Ext, ABD Triceps Ext 10lb  2x10 Biceps Curls 2lb 2x10 RUE PROM with end range holds   Grades 1-2 jt mobs RUE isometrics  Ext, flex, ABD, ER, IR 5''x3  Supine AAORM flexion dowel   05/06/23 Scapular retractions 2x10 RUE PROM with end range holds   Grades 1-2 jt mobs RUE isometrics  Ext, flex, ABD, ER, IR 5''x3 two sets    04/30/23  Eval, POC, HEP   Practiced and discussed HEP as below    PATIENT EDUCATION: Education details: exam findings, POC, HEP, really encouraged her to be protective of and cautious with surgical UE as it sounds like she has been using it much more than it is ready for given stage of healing  Person educated: Patient Education method: Explanation, Demonstration, Verbal cues, and Handouts Education comprehension: verbalized understanding, returned demonstration, and needs further education  HOME EXERCISE PROGRAM:  Access Code: [email or web address] URL: [email or web address] Date: 04/30/2023 Prepared by: Terrel Ferries  Exercises - Seated Scapular Retraction  - 3 x daily - 7 x weekly - 1 sets - 10 reps - Seated Backward Shoulder Rolls  - 3 x daily - 7 x weekly - 1 sets - 10 reps - Flexion-Extension Shoulder Pendulum with Table Support  - 3 x daily - 7 x weekly - 1 sets - 10-20 reps - Horizontal Shoulder Pendulum with Table Support  - 3 x daily - 7 x weekly - 1 sets - 10-20 reps - Wrist AROM Flexion Extension  - 3 x daily - 7 x weekly - 1 sets - 10 reps - Wrist Circumduction AROM  - 3 x daily - 7 x weekly - 1 sets - 10 reps - Seated Gripping Towel  - 3 x daily - 7 x weekly - 1 sets - 10 reps   ASSESSMENT:  CLINICAL IMPRESSION:  She saw her surgeon on Monday, he is very happy with her progress but advised us  to hold off  on strengthening until next week (05/27/23 at the earliest). Kept working on ROM based and postural interventions this session, will progress next visit.   Patient is a 63 y.o. F who was seen today for physical therapy treatment for : RIGHT SHOULDER ARTHROSCOPY  WITH ROTATOR CUFF REPAIR, subacromial decompression, distal clavicle excision, and Right Shouder open biceps tenodesis performed on 04/03/23. Pt enters without sling reporting the MD told her she did not have to wear it. Again encouraged her to not do quite as much around the house. Pt is 5 weeks out today initiated very light AAROM interventions, cueing pt to use LUE more.She has good overall ROM, ER being the most redistricted. Some end range pain with PROM. Will benefit from skilled PT services  to guide post-op rehab and assist in return to optimal level of function.  OBJECTIVE IMPAIRMENTS: decreased ROM, decreased strength, hypomobility, increased edema, increased fascial restrictions, increased muscle spasms, impaired flexibility, impaired UE functional use, improper body mechanics, postural dysfunction, and pain.   ACTIVITY LIMITATIONS: carrying, lifting, sleeping, bed mobility, continence, bathing, toileting, dressing, reach over head, hygiene/grooming, and caring for others  PARTICIPATION LIMITATIONS: meal prep, cleaning, laundry, driving, shopping, community activity, and yard work  PERSONAL FACTORS: Behavior pattern, Education, Fitness, Past/current experiences, Social background, and Time since onset of injury/illness/exacerbation are also affecting patient's functional outcome.   REHAB POTENTIAL: Good  CLINICAL DECISION MAKING: Stable/uncomplicated  EVALUATION COMPLEXITY: Low   GOALS: Goals reviewed with patient? Yes  SHORT TERM GOALS: Target date: 06/11/2023    Will be compliant with appropriate progressive HEP  Baseline: Goal status: Met 05/08/23  2.  Surgical shoulder PROM to be at least 150* flexion and scaption Baseline:  Goal status: 05/13/23 155 flex, 143 scaption Partly met  3.  Surgical shoulder AROM to be at least 120* flexion and scaption  Baseline:  Goal status: INITIAL  4.  Surgical shoulder IR and ER AROM to be at least 45* Baseline:  Goal status:  INITIAL  5.  Will demonstrate good awareness of postural mechanics with all functional tasks  Baseline:  Goal status: INITIAL    LONG TERM GOALS: Target date: 07/23/2023    MMT to be at least 4/5 surgical UE  Baseline:  Goal status: INITIAL  2.  Pain surgical UE to be no more than 2/10 with all functional tasks  Baseline:  Goal status: INITIAL  3.  Surgical shoulder AROM to be full all planes of motion  Baseline:  Goal status: INITIAL  4.  Will be able to perform all OH reaching and functional tasks related to self and home care without increase in pain  Baseline:  Goal status: INITIAL  5.  QuickDASH to have improved by at least 15 points to show improved subjective status  Baseline:  Goal status: INITIAL    PLAN:  PT FREQUENCY: 2x/week  PT DURATION: 12 weeks  PLANNED INTERVENTIONS: 97110-Therapeutic exercises, 97530- Therapeutic activity, 97112- Neuromuscular re-education, 97535- Self Care, and 16109- Manual therapy  PLAN FOR NEXT SESSION: per protocol in context of rotator cuff repair with biceps tenodesis; 7 weeks as of 05/22/23,  MD wants us  to delay start of strengthening work until Monday 05/27/23   Terrel Ferries, PT, DPT 05/22/23 11:45 AM   [facility] [address] [place], [place], [address] Phone: [phone]   Fax:  [phone]

## 2023-05-27 ENCOUNTER — Ambulatory Visit: Attending: Orthopaedic Surgery | Admitting: Physical Therapy

## 2023-05-27 ENCOUNTER — Encounter: Payer: Self-pay | Admitting: Physical Therapy

## 2023-05-27 DIAGNOSIS — R2681 Unsteadiness on feet: Secondary | ICD-10-CM | POA: Insufficient documentation

## 2023-05-27 DIAGNOSIS — R262 Difficulty in walking, not elsewhere classified: Secondary | ICD-10-CM | POA: Diagnosis present

## 2023-05-27 DIAGNOSIS — M25511 Pain in right shoulder: Secondary | ICD-10-CM | POA: Diagnosis present

## 2023-05-27 DIAGNOSIS — M25611 Stiffness of right shoulder, not elsewhere classified: Secondary | ICD-10-CM | POA: Insufficient documentation

## 2023-05-27 DIAGNOSIS — M6281 Muscle weakness (generalized): Secondary | ICD-10-CM | POA: Insufficient documentation

## 2023-05-27 DIAGNOSIS — M546 Pain in thoracic spine: Secondary | ICD-10-CM | POA: Insufficient documentation

## 2023-05-27 DIAGNOSIS — M47816 Spondylosis without myelopathy or radiculopathy, lumbar region: Secondary | ICD-10-CM | POA: Diagnosis present

## 2023-05-27 DIAGNOSIS — R293 Abnormal posture: Secondary | ICD-10-CM | POA: Insufficient documentation

## 2023-05-27 NOTE — Therapy (Signed)
 OUTPATIENT PHYSICAL THERAPY SHOULDER TREATMENT    Patient Name: Andrea Huff MRN: [id number] DOB:[birth year], 63 y.o., female Today's Date: 05/27/2023  END OF SESSION:  PT End of Session - 05/27/23 1017     Visit Number 7    Date for PT Re-Evaluation 07/23/23    PT Start Time 1015    PT Stop Time 1100    PT Time Calculation (min) 45 min    Activity Tolerance Patient tolerated treatment well    Behavior During Therapy [facility] for tasks assessed/performed               Past Medical History:  Diagnosis Date   Abnormal Pap smear    Age 72   Anxiety    Endometrial polyp 2007   Fibroid    GERD (gastroesophageal reflux disease)    H/O varicella    Hemorrhoids 10/18/06   History of measles, mumps, or rubella    Hypertension    IBS (irritable bowel syndrome)    Left lower quadrant pain 10/18/06   Nephropathy    Ovarian cyst 2010   Past Surgical History:  Procedure Laterality Date   CARDIOVASCULAR SURGERY     CHOLECYSTECTOMY     INNER EAR SURGERY     TOE AMPUTATION     TUBAL LIGATION     WISDOM TOOTH EXTRACTION     There are no active problems to display for this patient.   PCP: [name]   REFERRING PROVIDER: Amadeo Backers, MD  REFERRING DIAG: Diagnosis M75.111 (ICD-10-CM) - Incomplete rotator cuff tear or rupture of right shoulder, not specified as traumatic M19.011 (ICD-10-CM) - Primary osteoarthritis, right shoulder M75.21 (ICD-10-CM) - Bicipital tendinitis, right shoulder  THERAPY DIAG:  Stiffness of right shoulder, not elsewhere classified  Acute pain of right shoulder  Muscle weakness (generalized)  Abnormal posture  Rationale for Evaluation and Treatment: Rehabilitation  ONSET DATE: DOS: 04/03/23 Procedure: RIGHT SHOULDER ARTHROSCOPY WITH ROTATOR CUFF REPAIR, subacromial decompression, distal clavicle excision, and Right Shouder open biceps tenodesis  SUBJECTIVE:                                                                                                                                                                                       SUBJECTIVE STATEMENT:  Can start strength this week, Doctor said I could last monday   EVAL: Shoulder hurts all the time. Saw the PA from the surgeon and he said I can come out of the sling already but OK to wear if it hurts. I am right handed and I need to do a lot with that arm.  Not even pain meds help. Usually ina  power chair because if I weight bear I get abscesses on the bottom of my feet, this has been for the past 10 years.   Hand dominance: Right  PERTINENT HISTORY: See above   PAIN:  Are you having pain? No 9/10 when she tries to use it  PRECAUTIONS: Other: per rotator cuff repair with tenodesis protocol  RED FLAGS: None   WEIGHT BEARING RESTRICTIONS: No  FALLS:  Has patient fallen in last 6 months? Yes. Number of falls 1; face first fall on drive way, no injuries and no FOF   LIVING ENVIRONMENT: Lives with:  friend who helps  Lives in: House/apartment Stairs: No has basement but doesn't have to use it  Has following equipment at home: Single point cane, Wheelchair (power), and Ramped entry  OCCUPATION: Disability   PLOF: Independent, Independent with basic ADLs, Independent with gait, and Independent with transfers  PATIENT GOALS:get shoulder feeling better, be able to use it again   NEXT MD VISIT:   OBJECTIVE:  Note: Objective measures were completed at Evaluation unless otherwise noted.    PATIENT SURVEYS:  Quick Dash 72.7%  COGNITION: Overall cognitive status: Within functional limits for tasks assessed     SENSATION: Not tested  POSTURE: Rounded shoulders, forward head, thoracic kyphosis   UPPER EXTREMITY ROM:   Passive ROM Right eval Right 05/15/23  Shoulder flexion 90* limited by pain  140* before pain started   Shoulder extension    Shoulder scaption 100* limited by pain  120* scaption   Shoulder adduction     Shoulder internal rotation Hand to belly at 0* ABD   Shoulder external rotation 20* at zero degrees ABD 15-20* at zero degrees ABD   Elbow flexion    Elbow extension    Wrist flexion    Wrist extension    Wrist ulnar deviation    Wrist radial deviation    Wrist pronation    Wrist supination    (Blank rows = not tested)  UPPER EXTREMITY MMT:  MMT Right eval Left eval  Shoulder flexion    Shoulder extension    Shoulder abduction    Shoulder adduction    Shoulder internal rotation    Shoulder external rotation    Middle trapezius    Lower trapezius    Elbow flexion    Elbow extension    Wrist flexion    Wrist extension    Wrist ulnar deviation    Wrist radial deviation    Wrist pronation    Wrist supination    Grip strength (lbs)    (Blank rows = not tested)  MMT not tested at eval due to surgical precautions                                                                                                                                TREATMENT DATE:  05/27/23 UBE L1 X 2.5 min each AAROM 1lb WaTE flex, Ext, IR up  back x10 each  Shoulder Abd AROM x10, 1lb x10 Shoulder Flex 1lb 2x10  Shoulder ER AROM 2x10  Shoulder IR red 2x10  Rows & Ext red 2x10 RUE PROM with end range holds   Grades 1-2 jt mobs  05/22/23  PROM/stretching R shoulder as appropriate  Shoulder flexion AAROM 1# rod x12  Chest presses 1# rod x12 Shoulder scaption AAROM 1# x15  Supine flexion AROM 0# x10  Sidelying ABD AROM 0# x10  Seated ER AROM towel pinched to side x15 available ROM   STM R upper trap, middle and anterior delt with good tissue release noted       05/15/23  PROM all directions surgical UE AAROM for flexion, supine with dowel x12  AAROM ER with dowel, seated x12   Isometrics flexion/ABD/extension/ER 12x3 seconds each      05/13/23 AAROM cane flex, abd, ext RUE PROM    Grade 1-2 Jt mobs RUE isometrics flex, ext, abt, IR, ER x2 each     05/08/23. AAROM  cane Flex, Ext, ABD Triceps Ext 10lb 2x10 Biceps Curls 2lb 2x10 RUE PROM with end range holds   Grades 1-2 jt mobs RUE isometrics  Ext, flex, ABD, ER, IR 5''x3  Supine AAORM flexion dowel   05/06/23 Scapular retractions 2x10 RUE PROM with end range holds   Grades 1-2 jt mobs RUE isometrics  Ext, flex, ABD, ER, IR 5''x3 two sets    04/30/23  Eval, POC, HEP   Practiced and discussed HEP as below    PATIENT EDUCATION: Education details: exam findings, POC, HEP, really encouraged her to be protective of and cautious with surgical UE as it sounds like she has been using it much more than it is ready for given stage of healing  Person educated: Patient Education method: Explanation, Demonstration, Verbal cues, and Handouts Education comprehension: verbalized understanding, returned demonstration, and needs further education  HOME EXERCISE PROGRAM:  Access Code: [email or web address] URL: [email or web address] Date: 04/30/2023 Prepared by: Terrel Ferries  Exercises - Seated Scapular Retraction  - 3 x daily - 7 x weekly - 1 sets - 10 reps - Seated Backward Shoulder Rolls  - 3 x daily - 7 x weekly - 1 sets - 10 reps - Flexion-Extension Shoulder Pendulum with Table Support  - 3 x daily - 7 x weekly - 1 sets - 10-20 reps - Horizontal Shoulder Pendulum with Table Support  - 3 x daily - 7 x weekly - 1 sets - 10-20 reps - Wrist AROM Flexion Extension  - 3 x daily - 7 x weekly - 1 sets - 10 reps - Wrist Circumduction AROM  - 3 x daily - 7 x weekly - 1 sets - 10 reps - Seated Gripping Towel  - 3 x daily - 7 x weekly - 1 sets - 10 reps   ASSESSMENT:  CLINICAL IMPRESSION:  Pt tolerated a initial progression to light UE strengthening well. Constant cues needed with rows for appropriate UE positioning when doing the interventions. Weakness present with shoulder flexion and abduction.  Most limitation came with shoulder external rotation both passively and actively.  Patient is a 63 y.o.  F who was seen today for physical therapy treatment for : RIGHT SHOULDER ARTHROSCOPY WITH ROTATOR CUFF REPAIR, subacromial decompression, distal clavicle excision, and Right Shouder open biceps tenodesis performed on 04/03/23. Pt enters without sling reporting the MD told her she did not have to wear it. Again encouraged her to not do quite as much around the house. Pt  is 5 weeks out today initiated very light AAROM interventions, cueing pt to use LUE more.She has good overall ROM, ER being the most redistricted. Some end range pain with PROM. Will benefit from skilled PT services to guide post-op rehab and assist in return to optimal level of function.  OBJECTIVE IMPAIRMENTS: decreased ROM, decreased strength, hypomobility, increased edema, increased fascial restrictions, increased muscle spasms, impaired flexibility, impaired UE functional use, improper body mechanics, postural dysfunction, and pain.   ACTIVITY LIMITATIONS: carrying, lifting, sleeping, bed mobility, continence, bathing, toileting, dressing, reach over head, hygiene/grooming, and caring for others  PARTICIPATION LIMITATIONS: meal prep, cleaning, laundry, driving, shopping, community activity, and yard work  PERSONAL FACTORS: Behavior pattern, Education, Fitness, Past/current experiences, Social background, and Time since onset of injury/illness/exacerbation are also affecting patient's functional outcome.   REHAB POTENTIAL: Good  CLINICAL DECISION MAKING: Stable/uncomplicated  EVALUATION COMPLEXITY: Low   GOALS: Goals reviewed with patient? Yes  SHORT TERM GOALS: Target date: 06/11/2023    Will be compliant with appropriate progressive HEP  Baseline: Goal status: Met 05/08/23  2.  Surgical shoulder PROM to be at least 150* flexion and scaption Baseline:  Goal status: 05/13/23 155 flex, 143 scaption Partly met  3.  Surgical shoulder AROM to be at least 120* flexion and scaption  Baseline:  Goal status: INITIAL  4.   Surgical shoulder IR and ER AROM to be at least 45* Baseline:  Goal status: INITIAL  5.  Will demonstrate good awareness of postural mechanics with all functional tasks  Baseline:  Goal status: INITIAL    LONG TERM GOALS: Target date: 07/23/2023    MMT to be at least 4/5 surgical UE  Baseline:  Goal status: INITIAL  2.  Pain surgical UE to be no more than 2/10 with all functional tasks  Baseline:  Goal status: INITIAL  3.  Surgical shoulder AROM to be full all planes of motion  Baseline:  Goal status: INITIAL  4.  Will be able to perform all OH reaching and functional tasks related to self and home care without increase in pain  Baseline:  Goal status: INITIAL  5.  QuickDASH to have improved by at least 15 points to show improved subjective status  Baseline:  Goal status: INITIAL    PLAN:  PT FREQUENCY: 2x/week  PT DURATION: 12 weeks  PLANNED INTERVENTIONS: 97110-Therapeutic exercises, 97530- Therapeutic activity, 97112- Neuromuscular re-education, 97535- Self Care, and 16109- Manual therapy  PLAN FOR NEXT SESSION: per protocol in context of rotator cuff repair with biceps tenodesis; 7 weeks as of 05/22/23,  MD wants us  to delay start of strengthening work until Monday 05/27/23   Towanda Fret, PTA 05/27/23 10:17 AM

## 2023-05-29 ENCOUNTER — Ambulatory Visit: Admitting: Physical Therapy

## 2023-05-29 ENCOUNTER — Encounter: Payer: Self-pay | Admitting: Physical Therapy

## 2023-05-29 DIAGNOSIS — M25511 Pain in right shoulder: Secondary | ICD-10-CM

## 2023-05-29 DIAGNOSIS — M25611 Stiffness of right shoulder, not elsewhere classified: Secondary | ICD-10-CM

## 2023-05-29 DIAGNOSIS — M47816 Spondylosis without myelopathy or radiculopathy, lumbar region: Secondary | ICD-10-CM

## 2023-05-29 DIAGNOSIS — M6281 Muscle weakness (generalized): Secondary | ICD-10-CM

## 2023-05-29 DIAGNOSIS — R2681 Unsteadiness on feet: Secondary | ICD-10-CM

## 2023-05-29 DIAGNOSIS — M546 Pain in thoracic spine: Secondary | ICD-10-CM

## 2023-05-29 DIAGNOSIS — R262 Difficulty in walking, not elsewhere classified: Secondary | ICD-10-CM

## 2023-05-29 DIAGNOSIS — R293 Abnormal posture: Secondary | ICD-10-CM

## 2023-05-29 NOTE — Therapy (Signed)
 OUTPATIENT PHYSICAL THERAPY SHOULDER TREATMENT    Patient Name: Andrea Huff MRN: [id number] DOB:Oct 11, 1960, 63 y.o., female Today's Date: 05/29/2023  END OF SESSION:  PT End of Session - 05/29/23 1013     Visit Number 8    Number of Visits 25    Date for PT Re-Evaluation 07/23/23    Authorization Type [employer]    Authorization Time Period 04/30/23 to 07/23/23    PT Start Time 1015    PT Stop Time 1100    PT Time Calculation (min) 45 min               Past Medical History:  Diagnosis Date   Abnormal Pap smear    Age 48   Anxiety    Endometrial polyp 2007   Fibroid    GERD (gastroesophageal reflux disease)    H/O varicella    Hemorrhoids 10/18/06   History of measles, mumps, or rubella    Hypertension    IBS (irritable bowel syndrome)    Left lower quadrant pain 10/18/06   Nephropathy    Ovarian cyst 2010   Past Surgical History:  Procedure Laterality Date   CARDIOVASCULAR SURGERY     CHOLECYSTECTOMY     INNER EAR SURGERY     TOE AMPUTATION     TUBAL LIGATION     WISDOM TOOTH EXTRACTION     There are no active problems to display for this patient.   PCP: [name]   REFERRING PROVIDER: Amadeo Backers, MD  REFERRING DIAG: Diagnosis M75.111 (ICD-10-CM) - Incomplete rotator cuff tear or rupture of right shoulder, not specified as traumatic M19.011 (ICD-10-CM) - Primary osteoarthritis, right shoulder M75.21 (ICD-10-CM) - Bicipital tendinitis, right shoulder  THERAPY DIAG:  Stiffness of right shoulder, not elsewhere classified  Unsteadiness on feet  Acute pain of right shoulder  Difficulty in walking, not elsewhere classified  Muscle weakness (generalized)  Lumbar spondylosis  Abnormal posture  Pain in thoracic spine  Rationale for Evaluation and Treatment: Rehabilitation  ONSET DATE: DOS: 04/03/23 Procedure: RIGHT SHOULDER ARTHROSCOPY WITH ROTATOR CUFF REPAIR, subacromial decompression, distal clavicle  excision, and Right Shouder open biceps tenodesis  SUBJECTIVE:                                                                                                                                                                                      SUBJECTIVE STATEMENT: Sore after last session. Feels like a bruise over anterior shoulder.     EVAL: Shoulder hurts all the time. Saw the PA from the surgeon and he said I can come out of the sling already but OK to wear  if it hurts. I am right handed and I need to do a lot with that arm.  Not even pain meds help. Usually ina power chair because if I weight bear I get abscesses on the bottom of my feet, this has been for the past 10 years.   Hand dominance: Right  PERTINENT HISTORY: See above   PAIN:  Are you having pain? No 8/10 when she tries to use it  PRECAUTIONS: Other: per rotator cuff repair with tenodesis protocol  RED FLAGS: None   WEIGHT BEARING RESTRICTIONS: No  FALLS:  Has patient fallen in last 6 months? Yes. Number of falls 1; face first fall on drive way, no injuries and no FOF   LIVING ENVIRONMENT: Lives with:  friend who helps  Lives in: House/apartment Stairs: No has basement but doesn't have to use it  Has following equipment at home: Single point cane, Wheelchair (power), and Ramped entry  OCCUPATION: Disability   PLOF: Independent, Independent with basic ADLs, Independent with gait, and Independent with transfers  PATIENT GOALS:get shoulder feeling better, be able to use it again   NEXT MD VISIT:   OBJECTIVE:  Note: Objective measures were completed at Evaluation unless otherwise noted.    PATIENT SURVEYS:  Quick Dash 72.7%  COGNITION: Overall cognitive status: Within functional limits for tasks assessed     SENSATION: Not tested  POSTURE: Rounded shoulders, forward head, thoracic kyphosis   UPPER EXTREMITY ROM:   Passive ROM Right eval Right 05/15/23  Shoulder flexion 90* limited by pain   140* before pain started   Shoulder extension    Shoulder scaption 100* limited by pain  120* scaption   Shoulder adduction    Shoulder internal rotation Hand to belly at 0* ABD   Shoulder external rotation 20* at zero degrees ABD 15-20* at zero degrees ABD   Elbow flexion    Elbow extension    Wrist flexion    Wrist extension    Wrist ulnar deviation    Wrist radial deviation    Wrist pronation    Wrist supination    (Blank rows = not tested)  UPPER EXTREMITY MMT:  MMT Right eval Left eval  Shoulder flexion    Shoulder extension    Shoulder abduction    Shoulder adduction    Shoulder internal rotation    Shoulder external rotation    Middle trapezius    Lower trapezius    Elbow flexion    Elbow extension    Wrist flexion    Wrist extension    Wrist ulnar deviation    Wrist radial deviation    Wrist pronation    Wrist supination    Grip strength (lbs)    (Blank rows = not tested)  MMT not tested at eval due to surgical precautions  TREATMENT DATE:  05/29/23 UBE L1 x34min each way AAROM 1lb WaTE flex, ER x10 each  R shoulder PROM with end range holds and Jt mobs 1# shoulder abd 2x10 Yellow TB around arms while tapping targets Red band row, shoulder ext 2x10 each  20# triceps ext 2x10     05/27/23 UBE L1 X 2.5 min each AAROM 1lb WaTE flex, Ext, IR up back x10 each  Shoulder Abd AROM x10, 1lb x10 Shoulder Flex 1lb 2x10  Shoulder ER AROM 2x10  Shoulder IR red 2x10  Rows & Ext red 2x10 RUE PROM with end range holds   Grades 1-2 jt mobs  05/22/23  PROM/stretching R shoulder as appropriate  Shoulder flexion AAROM 1# rod x12  Chest presses 1# rod x12 Shoulder scaption AAROM 1# x15  Supine flexion AROM 0# x10  Sidelying ABD AROM 0# x10  Seated ER AROM towel pinched to side x15 available ROM   STM R upper trap, middle and  anterior delt with good tissue release noted       05/15/23  PROM all directions surgical UE AAROM for flexion, supine with dowel x12  AAROM ER with dowel, seated x12   Isometrics flexion/ABD/extension/ER 12x3 seconds each      05/13/23 AAROM cane flex, abd, ext RUE PROM    Grade 1-2 Jt mobs RUE isometrics flex, ext, abt, IR, ER x2 each     05/08/23. AAROM cane Flex, Ext, ABD Triceps Ext 10lb 2x10 Biceps Curls 2lb 2x10 RUE PROM with end range holds   Grades 1-2 jt mobs RUE isometrics  Ext, flex, ABD, ER, IR 5''x3  Supine AAORM flexion dowel   05/06/23 Scapular retractions 2x10 RUE PROM with end range holds   Grades 1-2 jt mobs RUE isometrics  Ext, flex, ABD, ER, IR 5''x3 two sets    04/30/23  Eval, POC, HEP   Practiced and discussed HEP as below    PATIENT EDUCATION: Education details: exam findings, POC, HEP, really encouraged her to be protective of and cautious with surgical UE as it sounds like she has been using it much more than it is ready for given stage of healing  Person educated: Patient Education method: Explanation, Demonstration, Verbal cues, and Handouts Education comprehension: verbalized understanding, returned demonstration, and needs further education  HOME EXERCISE PROGRAM:  Access Code: [email or web address] URL: [email or web address] Date: 04/30/2023 Prepared by: Terrel Ferries  Exercises - Seated Scapular Retraction  - 3 x daily - 7 x weekly - 1 sets - 10 reps - Seated Backward Shoulder Rolls  - 3 x daily - 7 x weekly - 1 sets - 10 reps - Flexion-Extension Shoulder Pendulum with Table Support  - 3 x daily - 7 x weekly - 1 sets - 10-20 reps - Horizontal Shoulder Pendulum with Table Support  - 3 x daily - 7 x weekly - 1 sets - 10-20 reps - Wrist AROM Flexion Extension  - 3 x daily - 7 x weekly - 1 sets - 10 reps - Wrist Circumduction AROM  - 3 x daily - 7 x weekly - 1 sets - 10 reps - Seated Gripping Towel  - 3 x daily - 7 x  weekly - 1 sets - 10 reps   ASSESSMENT:  CLINICAL IMPRESSION:  Pt arrived C/O stiffness and soreness since last session. She tolerated light UE strengthening well but had mild irritation with resisted abd. Constant cues needed with rows for proper form and appropriate UE positioning when doing the interventions. She has visibly  improved strength with and and flexion.  Most limitation came with shoulder external rotation both passively and actively.  Patient is a 63 y.o. F who was seen today for physical therapy treatment for : RIGHT SHOULDER ARTHROSCOPY WITH ROTATOR CUFF REPAIR, subacromial decompression, distal clavicle excision, and Right Shouder open biceps tenodesis performed on 04/03/23. Pt enters without sling reporting the MD told her she did not have to wear it. Again encouraged her to not do quite as much around the house. Pt is 5 weeks out today initiated very light AAROM interventions, cueing pt to use LUE more.She has good overall ROM, ER being the most redistricted. Some end range pain with PROM. Will benefit from skilled PT services to guide post-op rehab and assist in return to optimal level of function.  OBJECTIVE IMPAIRMENTS: decreased ROM, decreased strength, hypomobility, increased edema, increased fascial restrictions, increased muscle spasms, impaired flexibility, impaired UE functional use, improper body mechanics, postural dysfunction, and pain.   ACTIVITY LIMITATIONS: carrying, lifting, sleeping, bed mobility, continence, bathing, toileting, dressing, reach over head, hygiene/grooming, and caring for others  PARTICIPATION LIMITATIONS: meal prep, cleaning, laundry, driving, shopping, community activity, and yard work  PERSONAL FACTORS: Behavior pattern, Education, Fitness, Past/current experiences, Social background, and Time since onset of injury/illness/exacerbation are also affecting patient's functional outcome.   REHAB POTENTIAL: Good  CLINICAL DECISION MAKING:  Stable/uncomplicated  EVALUATION COMPLEXITY: Low   GOALS: Goals reviewed with patient? Yes  SHORT TERM GOALS: Target date: 06/11/2023    Will be compliant with appropriate progressive HEP  Baseline: Goal status: Met 05/08/23  2.  Surgical shoulder PROM to be at least 150* flexion and scaption Baseline:  Goal status: 05/13/23 155 flex, 143 scaption Partly met  3.  Surgical shoulder AROM to be at least 120* flexion and scaption  Baseline:  Goal status: INITIAL  4.  Surgical shoulder IR and ER AROM to be at least 45* Baseline:  Goal status: INITIAL  5.  Will demonstrate good awareness of postural mechanics with all functional tasks  Baseline:  Goal status: INITIAL    LONG TERM GOALS: Target date: 07/23/2023    MMT to be at least 4/5 surgical UE  Baseline:  Goal status: INITIAL  2.  Pain surgical UE to be no more than 2/10 with all functional tasks  Baseline:  Goal status: INITIAL  3.  Surgical shoulder AROM to be full all planes of motion  Baseline:  Goal status: INITIAL  4.  Will be able to perform all OH reaching and functional tasks related to self and home care without increase in pain  Baseline:  Goal status: INITIAL  5.  QuickDASH to have improved by at least 15 points to show improved subjective status  Baseline:  Goal status: INITIAL    PLAN:  PT FREQUENCY: 2x/week  PT DURATION: 12 weeks  PLANNED INTERVENTIONS: 97110-Therapeutic exercises, 97530- Therapeutic activity, [id number]- Neuromuscular re-education, 97535- Self Care, and 14782- Manual therapy  PLAN FOR NEXT SESSION: Assess Goals next visit. Per protocol in context of rotator cuff repair with biceps tenodesis; 7 weeks as of 05/22/23,  MD wants us  to delay start of strengthening work until Monday 05/27/23   Towanda Fret, PTA 05/29/23 10:14 AM

## 2023-06-11 ENCOUNTER — Ambulatory Visit: Admitting: Physical Therapy

## 2023-06-14 ENCOUNTER — Encounter: Payer: Self-pay | Admitting: Physical Therapy

## 2023-06-14 ENCOUNTER — Ambulatory Visit: Admitting: Physical Therapy

## 2023-06-14 DIAGNOSIS — M25611 Stiffness of right shoulder, not elsewhere classified: Secondary | ICD-10-CM | POA: Diagnosis not present

## 2023-06-14 DIAGNOSIS — M6281 Muscle weakness (generalized): Secondary | ICD-10-CM

## 2023-06-14 DIAGNOSIS — R293 Abnormal posture: Secondary | ICD-10-CM

## 2023-06-14 DIAGNOSIS — M25511 Pain in right shoulder: Secondary | ICD-10-CM

## 2023-06-14 NOTE — Therapy (Signed)
 OUTPATIENT PHYSICAL THERAPY SHOULDER TREATMENT    Patient Name: Andrea Huff MRN: [id number] DOB:10/02/1960, 63 y.o., female Today's Date: 06/14/2023  END OF SESSION:  PT End of Session - 06/14/23 1014     Visit Number 9    Date for PT Re-Evaluation 07/23/23    PT Start Time 1015    PT Stop Time 1100    PT Time Calculation (min) 45 min    Activity Tolerance Patient tolerated treatment well    Behavior During Therapy [facility] for tasks assessed/performed               Past Medical History:  Diagnosis Date   Abnormal Pap smear    Age 79   Anxiety    Endometrial polyp 2007   Fibroid    GERD (gastroesophageal reflux disease)    H/O varicella    Hemorrhoids 10/18/06   History of measles, mumps, or rubella    Hypertension    IBS (irritable bowel syndrome)    Left lower quadrant pain 10/18/06   Nephropathy    Ovarian cyst 2010   Past Surgical History:  Procedure Laterality Date   CARDIOVASCULAR SURGERY     CHOLECYSTECTOMY     INNER EAR SURGERY     TOE AMPUTATION     TUBAL LIGATION     WISDOM TOOTH EXTRACTION     There are no active problems to display for this patient.   PCP: [name]   REFERRING PROVIDER: Amadeo Backers, MD  REFERRING DIAG: Diagnosis M75.111 (ICD-10-CM) - Incomplete rotator cuff tear or rupture of right shoulder, not specified as traumatic M19.011 (ICD-10-CM) - Primary osteoarthritis, right shoulder M75.21 (ICD-10-CM) - Bicipital tendinitis, right shoulder  THERAPY DIAG:  Stiffness of right shoulder, not elsewhere classified  Acute pain of right shoulder  Abnormal posture  Muscle weakness (generalized)  Rationale for Evaluation and Treatment: Rehabilitation  ONSET DATE: DOS: 04/03/23 Procedure: RIGHT SHOULDER ARTHROSCOPY WITH ROTATOR CUFF REPAIR, subacromial decompression, distal clavicle excision, and Right Shouder open biceps tenodesis  SUBJECTIVE:                                                                                                                                                                                       SUBJECTIVE STATEMENT: Still hurts, difficulty reaching behind back    EVAL: Shoulder hurts all the time. Saw the PA from the surgeon and he said I can come out of the sling already but OK to wear if it hurts. I am right handed and I need to do a lot with that arm.  Not even pain meds help. Usually ina power chair because if I  weight bear I get abscesses on the bottom of my feet, this has been for the past 10 years.   Hand dominance: Right  PERTINENT HISTORY: See above   PAIN:  Are you having pain? No 8/10 when she tries to use it  PRECAUTIONS: Other: per rotator cuff repair with tenodesis protocol  RED FLAGS: None   WEIGHT BEARING RESTRICTIONS: No  FALLS:  Has patient fallen in last 6 months? Yes. Number of falls 1; face first fall on drive way, no injuries and no FOF   LIVING ENVIRONMENT: Lives with: friend who helps  Lives in: House/apartment Stairs: No has basement but doesn't have to use it  Has following equipment at home: Single point cane, Wheelchair (power), and Ramped entry  OCCUPATION: Disability   PLOF: Independent, Independent with basic ADLs, Independent with gait, and Independent with transfers  PATIENT GOALS:get shoulder feeling better, be able to use it again   NEXT MD VISIT:   OBJECTIVE:  Note: Objective measures were completed at Evaluation unless otherwise noted.    PATIENT SURVEYS:  Quick Dash 72.7%  COGNITION: Overall cognitive status: Within functional limits for tasks assessed     SENSATION: Not tested  POSTURE: Rounded shoulders, forward head, thoracic kyphosis   UPPER EXTREMITY ROM:   Passive ROM Right eval Right 05/15/23 Right AROM 06/14/23  Shoulder flexion 90* limited by pain  140* before pain started  150  Shoulder extension     Shoulder scaption 100* limited by pain  120* scaption    Shoulder adduction    139  Shoulder internal rotation Hand to belly at 0* ABD  52  Shoulder external rotation 20* at zero degrees ABD 15-20* at zero degrees ABD  73  Elbow flexion     Elbow extension     Wrist flexion     Wrist extension     Wrist ulnar deviation     Wrist radial deviation     Wrist pronation     Wrist supination     (Blank rows = not tested)  UPPER EXTREMITY MMT:  MMT Right eval Left eval  Shoulder flexion    Shoulder extension    Shoulder abduction    Shoulder adduction    Shoulder internal rotation    Shoulder external rotation    Middle trapezius    Lower trapezius    Elbow flexion    Elbow extension    Wrist flexion    Wrist extension    Wrist ulnar deviation    Wrist radial deviation    Wrist pronation    Wrist supination    Grip strength (lbs)    (Blank rows = not tested)  MMT not tested at eval due to surgical precautions                                                                                                                                TREATMENT DATE:  06/14/23 UBE L1  x1min each way GOALS Rows & Lats 15lb 2x10 Shoulder Flex & abd 1lb 2x10 Triceps Ext 10lb 2x10   05/29/23 UBE L1 x73min each way AAROM 1lb WaTE flex, ER x10 each  R shoulder PROM with end range holds and Jt mobs 1# shoulder abd 2x10 Yellow TB around arms while tapping targets Red band row, shoulder ext 2x10 each  20# triceps ext 2x10    05/27/23 UBE L1 X 2.5 min each AAROM 1lb WaTE flex, Ext, IR up back x10 each  Shoulder Abd AROM x10, 1lb x10 Shoulder Flex 1lb 2x10  Shoulder ER AROM 2x10  Shoulder IR red 2x10  Rows & Ext red 2x10 RUE PROM with end range holds   Grades 1-2 jt mobs  05/22/23  PROM/stretching R shoulder as appropriate  Shoulder flexion AAROM 1# rod x12  Chest presses 1# rod x12 Shoulder scaption AAROM 1# x15  Supine flexion AROM 0# x10  Sidelying ABD AROM 0# x10  Seated ER AROM towel pinched to side x15 available ROM   STM R upper trap, middle and  anterior delt with good tissue release noted       05/15/23  PROM all directions surgical UE AAROM for flexion, supine with dowel x12  AAROM ER with dowel, seated x12   Isometrics flexion/ABD/extension/ER 12x3 seconds each      05/13/23 AAROM cane flex, abd, ext RUE PROM    Grade 1-2 Jt mobs RUE isometrics flex, ext, abt, IR, ER x2 each   PATIENT EDUCATION: Education details: exam findings, POC, HEP, really encouraged her to be protective of and cautious with surgical UE as it sounds like she has been using it much more than it is ready for given stage of healing  Person educated: Patient Education method: Explanation, Demonstration, Verbal cues, and Handouts Education comprehension: verbalized understanding, returned demonstration, and needs further education  HOME EXERCISE PROGRAM:  Access Code: [email or web address] URL: [email or web address] Date: 04/30/2023 Prepared by: Terrel Ferries  Exercises - Seated Scapular Retraction  - 3 x daily - 7 x weekly - 1 sets - 10 reps - Seated Backward Shoulder Rolls  - 3 x daily - 7 x weekly - 1 sets - 10 reps - Flexion-Extension Shoulder Pendulum with Table Support  - 3 x daily - 7 x weekly - 1 sets - 10-20 reps - Horizontal Shoulder Pendulum with Table Support  - 3 x daily - 7 x weekly - 1 sets - 10-20 reps - Wrist AROM Flexion Extension  - 3 x daily - 7 x weekly - 1 sets - 10 reps - Wrist Circumduction AROM  - 3 x daily - 7 x weekly - 1 sets - 10 reps - Seated Gripping Towel  - 3 x daily - 7 x weekly - 1 sets - 10 reps   ASSESSMENT:  CLINICAL IMPRESSION:  Pt arrived C/O stiffness and soreness. Despite reports she has progressed increasing her RUE AROM in all directions. Minium shoulder elevation with shoulder flex and abd. No issue with a progression to machine level interventions. Tactile cue needed to keep shoulder in place with ER/IR   Patient is a 63 y.o. F who was seen today for physical therapy treatment for : RIGHT  SHOULDER ARTHROSCOPY WITH ROTATOR CUFF REPAIR, subacromial decompression, distal clavicle excision, and Right Shouder open biceps tenodesis performed on 04/03/23. Pt enters without sling reporting the MD told her she did not have to wear it. Again encouraged her to not do quite as much around the house. Pt is  5 weeks out today initiated very light AAROM interventions, cueing pt to use LUE more.She has good overall ROM, ER being the most redistricted. Some end range pain with PROM. Will benefit from skilled PT services to guide post-op rehab and assist in return to optimal level of function.  OBJECTIVE IMPAIRMENTS: decreased ROM, decreased strength, hypomobility, increased edema, increased fascial restrictions, increased muscle spasms, impaired flexibility, impaired UE functional use, improper body mechanics, postural dysfunction, and pain.   ACTIVITY LIMITATIONS: carrying, lifting, sleeping, bed mobility, continence, bathing, toileting, dressing, reach over head, hygiene/grooming, and caring for others  PARTICIPATION LIMITATIONS: meal prep, cleaning, laundry, driving, shopping, community activity, and yard work  PERSONAL FACTORS: Behavior pattern, Education, Fitness, Past/current experiences, Social background, and Time since onset of injury/illness/exacerbation are also affecting patient's functional outcome.   REHAB POTENTIAL: Good  CLINICAL DECISION MAKING: Stable/uncomplicated  EVALUATION COMPLEXITY: Low   GOALS: Goals reviewed with patient? Yes  SHORT TERM GOALS: Target date: 06/11/2023    Will be compliant with appropriate progressive HEP  Baseline: Goal status: Met 05/08/23  2.  Surgical shoulder PROM to be at least 150* flexion and scaption Baseline:  Goal status: 05/13/23 155 flex, 143 scaption Partly met, Met 06/14/23  3.  Surgical shoulder AROM to be at least 120* flexion and scaption  Baseline:  Goal status: Met 06/14/23  4.  Surgical shoulder IR and ER AROM to be at least  45* Baseline:  Goal status: Met 06/14/23  5.  Will demonstrate good awareness of postural mechanics with all functional tasks  Baseline:  Goal status: Partly Met 06/14/23    LONG TERM GOALS: Target date: 07/23/2023    MMT to be at least 4/5 surgical UE  Baseline:  Goal status: INITIAL  2.  Pain surgical UE to be no more than 2/10 with all functional tasks  Baseline:  Goal status: INITIAL  3.  Surgical shoulder AROM to be full all planes of motion  Baseline:  Goal status: INITIAL  4.  Will be able to perform all OH reaching and functional tasks related to self and home care without increase in pain  Baseline:  Goal status: INITIAL  5.  QuickDASH to have improved by at least 15 points to show improved subjective status  Baseline:  Goal status: 56.8/100= 56.8%    PLAN:  PT FREQUENCY: 2x/week  PT DURATION: 12 weeks  PLANNED INTERVENTIONS: 97110-Therapeutic exercises, 97530- Therapeutic activity, [id number]- Neuromuscular re-education, 97535- Self Care, and 16109- Manual therapy  PLAN FOR NEXT SESSION: Assess Goals next visit. Per protocol in context of rotator cuff repair with biceps tenodesis; 7 weeks as of 05/22/23,  MD wants us  to delay start of strengthening work until Monday 05/27/23   Towanda Fret, PTA 06/14/23 10:15 AM

## 2023-06-19 ENCOUNTER — Encounter: Payer: Self-pay | Admitting: Physical Therapy

## 2023-06-19 ENCOUNTER — Ambulatory Visit: Admitting: Physical Therapy

## 2023-06-19 DIAGNOSIS — M25611 Stiffness of right shoulder, not elsewhere classified: Secondary | ICD-10-CM

## 2023-06-19 DIAGNOSIS — M6281 Muscle weakness (generalized): Secondary | ICD-10-CM

## 2023-06-19 DIAGNOSIS — M25511 Pain in right shoulder: Secondary | ICD-10-CM

## 2023-06-19 NOTE — Therapy (Signed)
 OUTPATIENT PHYSICAL THERAPY SHOULDER TREATMENT    Patient Name: Andrea Huff MRN: [id number] DOB:09/18/1960, 63 y.o., female Today's Date: 06/19/2023  END OF SESSION:  PT End of Session - 06/19/23 1021     Visit Number 10    Date for PT Re-Evaluation 07/23/23    PT Start Time 1021    PT Stop Time 0100    PT Time Calculation (min) 879 min    Activity Tolerance Patient tolerated treatment well    Behavior During Therapy [facility] for tasks assessed/performed               Past Medical History:  Diagnosis Date   Abnormal Pap smear    Age 33   Anxiety    Endometrial polyp 2007   Fibroid    GERD (gastroesophageal reflux disease)    H/O varicella    Hemorrhoids 10/18/06   History of measles, mumps, or rubella    Hypertension    IBS (irritable bowel syndrome)    Left lower quadrant pain 10/18/06   Nephropathy    Ovarian cyst 2010   Past Surgical History:  Procedure Laterality Date   CARDIOVASCULAR SURGERY     CHOLECYSTECTOMY     INNER EAR SURGERY     TOE AMPUTATION     TUBAL LIGATION     WISDOM TOOTH EXTRACTION     There are no active problems to display for this patient.   PCP: [name]   REFERRING PROVIDER: Amadeo Backers, MD  REFERRING DIAG: Diagnosis M75.111 (ICD-10-CM) - Incomplete rotator cuff tear or rupture of right shoulder, not specified as traumatic M19.011 (ICD-10-CM) - Primary osteoarthritis, right shoulder M75.21 (ICD-10-CM) - Bicipital tendinitis, right shoulder  THERAPY DIAG:  Stiffness of right shoulder, not elsewhere classified  Acute pain of right shoulder  Muscle weakness (generalized)  Rationale for Evaluation and Treatment: Rehabilitation  ONSET DATE: DOS: 04/03/23 Procedure: RIGHT SHOULDER ARTHROSCOPY WITH ROTATOR CUFF REPAIR, subacromial decompression, distal clavicle excision, and Right Shouder open biceps tenodesis  SUBJECTIVE:                                                                                                                                                                                       SUBJECTIVE STATEMENT: "Good" Shoulder is the same    EVAL: Shoulder hurts all the time. Saw the PA from the surgeon and he said I can come out of the sling already but OK to wear if it hurts. I am right handed and I need to do a lot with that arm.  Not even pain meds help. Usually ina power chair because if I weight bear I get  abscesses on the bottom of my feet, this has been for the past 10 years.   Hand dominance: Right  PERTINENT HISTORY: See above   PAIN:  Are you having pain? No 8/10 when she tries to use it  PRECAUTIONS: Other: per rotator cuff repair with tenodesis protocol  RED FLAGS: None   WEIGHT BEARING RESTRICTIONS: No  FALLS:  Has patient fallen in last 6 months? Yes. Number of falls 1; face first fall on drive way, no injuries and no FOF   LIVING ENVIRONMENT: Lives with: friend who helps  Lives in: House/apartment Stairs: No has basement but doesn't have to use it  Has following equipment at home: Single point cane, Wheelchair (power), and Ramped entry  OCCUPATION: Disability   PLOF: Independent, Independent with basic ADLs, Independent with gait, and Independent with transfers  PATIENT GOALS:get shoulder feeling better, be able to use it again   NEXT MD VISIT:   OBJECTIVE:  Note: Objective measures were completed at Evaluation unless otherwise noted.    PATIENT SURVEYS:  Quick Dash 72.7%  COGNITION: Overall cognitive status: Within functional limits for tasks assessed     SENSATION: Not tested  POSTURE: Rounded shoulders, forward head, thoracic kyphosis   UPPER EXTREMITY ROM:   Passive ROM Right eval Right 05/15/23 Right AROM 06/14/23  Shoulder flexion 90* limited by pain  140* before pain started  150  Shoulder extension     Shoulder scaption 100* limited by pain  120* scaption    Shoulder adduction   139  Shoulder internal rotation  Hand to belly at 0* ABD  52  Shoulder external rotation 20* at zero degrees ABD 15-20* at zero degrees ABD  73  Elbow flexion     Elbow extension     Wrist flexion     Wrist extension     Wrist ulnar deviation     Wrist radial deviation     Wrist pronation     Wrist supination     (Blank rows = not tested)  UPPER EXTREMITY MMT:  MMT Right eval Left eval  Shoulder flexion    Shoulder extension    Shoulder abduction    Shoulder adduction    Shoulder internal rotation    Shoulder external rotation    Middle trapezius    Lower trapezius    Elbow flexion    Elbow extension    Wrist flexion    Wrist extension    Wrist ulnar deviation    Wrist radial deviation    Wrist pronation    Wrist supination    Grip strength (lbs)    (Blank rows = not tested)  MMT not tested at eval due to surgical precautions                                                                                                                                TREATMENT DATE:  06/19/23 UBE L2 x 3 min each  AAROM 1lb WaTE flex, Ext, IR x10 each  Shoulder Flex & abd 1lb 2x10 RUE ER yellow 2x10 RUE PROM with end range holds   Grades 1-2 jt mobs  06/14/23 UBE L1 x62min each way GOALS Rows & Lats 15lb 2x10 Shoulder Flex & abd 1lb 2x10 Triceps Ext 10lb 2x10   05/29/23 UBE L1 x56min each way AAROM 1lb WaTE flex, ER x10 each  R shoulder PROM with end range holds and Jt mobs 1# shoulder abd 2x10 Yellow TB around arms while tapping targets Red band row, shoulder ext 2x10 each  20# triceps ext 2x10    05/27/23 UBE L1 X 2.5 min each AAROM 1lb WaTE flex, Ext, IR up back x10 each  Shoulder Abd AROM x10, 1lb x10 Shoulder Flex 1lb 2x10  Shoulder ER AROM 2x10  Shoulder IR red 2x10  Rows & Ext red 2x10 RUE PROM with end range holds   Grades 1-2 jt mobs  05/22/23  PROM/stretching R shoulder as appropriate  Shoulder flexion AAROM 1# rod x12  Chest presses 1# rod x12 Shoulder scaption AAROM 1# x15   Supine flexion AROM 0# x10  Sidelying ABD AROM 0# x10  Seated ER AROM towel pinched to side x15 available ROM   STM R upper trap, middle and anterior delt with good tissue release noted       05/15/23  PROM all directions surgical UE AAROM for flexion, supine with dowel x12  AAROM ER with dowel, seated x12   Isometrics flexion/ABD/extension/ER 12x3 seconds each      05/13/23 AAROM cane flex, abd, ext RUE PROM    Grade 1-2 Jt mobs RUE isometrics flex, ext, abt, IR, ER x2 each   PATIENT EDUCATION: Education details: exam findings, POC, HEP, really encouraged her to be protective of and cautious with surgical UE as it sounds like she has been using it much more than it is ready for given stage of healing  Person educated: Patient Education method: Explanation, Demonstration, Verbal cues, and Handouts Education comprehension: verbalized understanding, returned demonstration, and needs further education  HOME EXERCISE PROGRAM:  Access Code: [email or web address] URL: [email or web address] Date: 04/30/2023 Prepared by: Terrel Ferries  Exercises - Seated Scapular Retraction  - 3 x daily - 7 x weekly - 1 sets - 10 reps - Seated Backward Shoulder Rolls  - 3 x daily - 7 x weekly - 1 sets - 10 reps - Flexion-Extension Shoulder Pendulum with Table Support  - 3 x daily - 7 x weekly - 1 sets - 10-20 reps - Horizontal Shoulder Pendulum with Table Support  - 3 x daily - 7 x weekly - 1 sets - 10-20 reps - Wrist AROM Flexion Extension  - 3 x daily - 7 x weekly - 1 sets - 10 reps - Wrist Circumduction AROM  - 3 x daily - 7 x weekly - 1 sets - 10 reps - Seated Gripping Towel  - 3 x daily - 7 x weekly - 1 sets - 10 reps   ASSESSMENT:  CLINICAL IMPRESSION:  Pt arrived ~ 6 minutes late,  C/O stiffness and soreness in the R shoulder. Continues with some light AAROM. Pt did report that she did not have a rotator cuff tare only a biceps tendon relocation.. Tactile cue needed to keep  shoulder in place with ER/IR. Passive ER is the most limited.   Patient is a 63 y.o. F who was seen today for physical therapy treatment for : RIGHT SHOULDER ARTHROSCOPY WITH ROTATOR CUFF REPAIR, subacromial  decompression, distal clavicle excision, and Right Shouder open biceps tenodesis performed on 04/03/23. Pt enters without sling reporting the MD told her she did not have to wear it. Again encouraged her to not do quite as much around the house. Pt is 5 weeks out today initiated very light AAROM interventions, cueing pt to use LUE more.She has good overall ROM, ER being the most redistricted. Some end range pain with PROM. Will benefit from skilled PT services to guide post-op rehab and assist in return to optimal level of function.  OBJECTIVE IMPAIRMENTS: decreased ROM, decreased strength, hypomobility, increased edema, increased fascial restrictions, increased muscle spasms, impaired flexibility, impaired UE functional use, improper body mechanics, postural dysfunction, and pain.   ACTIVITY LIMITATIONS: carrying, lifting, sleeping, bed mobility, continence, bathing, toileting, dressing, reach over head, hygiene/grooming, and caring for others  PARTICIPATION LIMITATIONS: meal prep, cleaning, laundry, driving, shopping, community activity, and yard work  PERSONAL FACTORS: Behavior pattern, Education, Fitness, Past/current experiences, Social background, and Time since onset of injury/illness/exacerbation are also affecting patient's functional outcome.   REHAB POTENTIAL: Good  CLINICAL DECISION MAKING: Stable/uncomplicated  EVALUATION COMPLEXITY: Low   GOALS: Goals reviewed with patient? Yes  SHORT TERM GOALS: Target date: 06/11/2023    Will be compliant with appropriate progressive HEP  Baseline: Goal status: Met 05/08/23  2.  Surgical shoulder PROM to be at least 150* flexion and scaption Baseline:  Goal status: 05/13/23 155 flex, 143 scaption Partly met, Met 06/14/23  3.  Surgical  shoulder AROM to be at least 120* flexion and scaption  Baseline:  Goal status: Met 06/14/23  4.  Surgical shoulder IR and ER AROM to be at least 45* Baseline:  Goal status: Met 06/14/23  5.  Will demonstrate good awareness of postural mechanics with all functional tasks  Baseline:  Goal status: Partly Met 06/14/23    LONG TERM GOALS: Target date: 07/23/2023    MMT to be at least 4/5 surgical UE  Baseline:  Goal status: INITIAL  2.  Pain surgical UE to be no more than 2/10 with all functional tasks  Baseline:  Goal status: INITIAL  3.  Surgical shoulder AROM to be full all planes of motion  Baseline:  Goal status: INITIAL  4.  Will be able to perform all OH reaching and functional tasks related to self and home care without increase in pain  Baseline:  Goal status: INITIAL  5.  QuickDASH to have improved by at least 15 points to show improved subjective status  Baseline:  Goal status: 56.8/100= 56.8%    PLAN:  PT FREQUENCY: 2x/week  PT DURATION: 12 weeks  PLANNED INTERVENTIONS: 97110-Therapeutic exercises, 97530- Therapeutic activity, [id number]- Neuromuscular re-education, 97535- Self Care, and 16109- Manual therapy  PLAN FOR NEXT SESSION: Assess Goals next visit. Per protocol in context of rotator cuff repair with biceps tenodesis; 7 weeks as of 05/22/23,  MD wants us  to delay start of strengthening work until Monday 05/27/23   Towanda Fret, PTA 06/19/23 10:22 AM

## 2023-06-21 ENCOUNTER — Ambulatory Visit: Admitting: Physical Therapy

## 2023-06-21 ENCOUNTER — Encounter: Payer: Self-pay | Admitting: Physical Therapy

## 2023-06-21 DIAGNOSIS — R293 Abnormal posture: Secondary | ICD-10-CM

## 2023-06-21 DIAGNOSIS — M25611 Stiffness of right shoulder, not elsewhere classified: Secondary | ICD-10-CM

## 2023-06-21 DIAGNOSIS — M25511 Pain in right shoulder: Secondary | ICD-10-CM

## 2023-06-21 DIAGNOSIS — M6281 Muscle weakness (generalized): Secondary | ICD-10-CM

## 2023-06-21 NOTE — Therapy (Signed)
 OUTPATIENT PHYSICAL THERAPY SHOULDER TREATMENT    Patient Name: Andrea Huff MRN: [id number] DOB:[birth year], 63 y.o., female Today's Date: 06/21/2023  END OF SESSION:  PT End of Session - 06/21/23 1018     Visit Number 11    Date for PT Re-Evaluation 07/23/23    PT Start Time 1019    PT Stop Time 1100    PT Time Calculation (min) 41 min    Activity Tolerance Patient tolerated treatment well    Behavior During Therapy [facility] for tasks assessed/performed               Past Medical History:  Diagnosis Date   Abnormal Pap smear    Age 66   Anxiety    Endometrial polyp 2007   Fibroid    GERD (gastroesophageal reflux disease)    H/O varicella    Hemorrhoids 10/18/06   History of measles, mumps, or rubella    Hypertension    IBS (irritable bowel syndrome)    Left lower quadrant pain 10/18/06   Nephropathy    Ovarian cyst 2010   Past Surgical History:  Procedure Laterality Date   CARDIOVASCULAR SURGERY     CHOLECYSTECTOMY     INNER EAR SURGERY     TOE AMPUTATION     TUBAL LIGATION     WISDOM TOOTH EXTRACTION     There are no active problems to display for this patient.   PCP: [name]   REFERRING PROVIDER: Amadeo Backers, MD  REFERRING DIAG: Diagnosis M75.111 (ICD-10-CM) - Incomplete rotator cuff tear or rupture of right shoulder, not specified as traumatic M19.011 (ICD-10-CM) - Primary osteoarthritis, right shoulder M75.21 (ICD-10-CM) - Bicipital tendinitis, right shoulder  THERAPY DIAG:  Stiffness of right shoulder, not elsewhere classified  Acute pain of right shoulder  Muscle weakness (generalized)  Abnormal posture  Rationale for Evaluation and Treatment: Rehabilitation  ONSET DATE: DOS: 04/03/23 Procedure: RIGHT SHOULDER ARTHROSCOPY WITH ROTATOR CUFF REPAIR, subacromial decompression, distal clavicle excision, and Right Shouder open biceps tenodesis  SUBJECTIVE:                                                                                                                                                                                       SUBJECTIVE STATEMENT: It just hurt, in the same two spots    EVAL: Shoulder hurts all the time. Saw the PA from the surgeon and he said I can come out of the sling already but OK to wear if it hurts. I am right handed and I need to do a lot with that arm.  Not even pain meds help. Usually ina power chair because  if I weight bear I get abscesses on the bottom of my feet, this has been for the past 10 years.   Hand dominance: Right  PERTINENT HISTORY: See above   PAIN:  Are you having pain? No 8/10 when she tries to use it  PRECAUTIONS: Other: per rotator cuff repair with tenodesis protocol  RED FLAGS: None   WEIGHT BEARING RESTRICTIONS: No  FALLS:  Has patient fallen in last 6 months? Yes. Number of falls 1; face first fall on drive way, no injuries and no FOF   LIVING ENVIRONMENT: Lives with: friend who helps  Lives in: House/apartment Stairs: No has basement but doesn't have to use it  Has following equipment at home: Single point cane, Wheelchair (power), and Ramped entry  OCCUPATION: Disability   PLOF: Independent, Independent with basic ADLs, Independent with gait, and Independent with transfers  PATIENT GOALS:get shoulder feeling better, be able to use it again   NEXT MD VISIT:   OBJECTIVE:  Note: Objective measures were completed at Evaluation unless otherwise noted.    PATIENT SURVEYS:  Quick Dash 72.7%  COGNITION: Overall cognitive status: Within functional limits for tasks assessed     SENSATION: Not tested  POSTURE: Rounded shoulders, forward head, thoracic kyphosis   UPPER EXTREMITY ROM:   Passive ROM Right eval Right 05/15/23 Right AROM 06/14/23 Right AROM 06/21/23  Shoulder flexion 90* limited by pain  140* before pain started  150 153  Shoulder extension      Shoulder scaption 100* limited by pain  120* scaption      Shoulder adduction   139 140  Shoulder internal rotation Hand to belly at 0* ABD  52   Shoulder external rotation 20* at zero degrees ABD 15-20* at zero degrees ABD  73   Elbow flexion      Elbow extension      Wrist flexion      Wrist extension      Wrist ulnar deviation      Wrist radial deviation      Wrist pronation      Wrist supination      (Blank rows = not tested)  UPPER EXTREMITY MMT:  MMT Right eval Left eval  Shoulder flexion    Shoulder extension    Shoulder abduction    Shoulder adduction    Shoulder internal rotation    Shoulder external rotation    Middle trapezius    Lower trapezius    Elbow flexion    Elbow extension    Wrist flexion    Wrist extension    Wrist ulnar deviation    Wrist radial deviation    Wrist pronation    Wrist supination    Grip strength (lbs)    (Blank rows = not tested)  MMT not tested at eval due to surgical precautions  TREATMENT DATE:  06/21/23 UBE L1 x3 min each Rows & Lats 15lb 2x10 RUE ER & IR yellow 2x10 Shoulder Flex & abd 2x10 Rows red 2x10  06/19/23 UBE L2 x 3 min each AAROM 1lb WaTE flex, Ext, IR x10 each  Shoulder Flex & abd 1lb 2x10 RUE ER yellow 2x10 RUE PROM with end range holds   Grades 1-2 jt mobs  06/14/23 UBE L1 x61min each way GOALS Rows & Lats 15lb 2x10 Shoulder Flex & abd 1lb 2x10 Triceps Ext 10lb 2x10   05/29/23 UBE L1 x64min each way AAROM 1lb WaTE flex, ER x10 each  R shoulder PROM with end range holds and Jt mobs 1# shoulder abd 2x10 Yellow TB around arms while tapping targets Red band row, shoulder ext 2x10 each  20# triceps ext 2x10    05/27/23 UBE L1 X 2.5 min each AAROM 1lb WaTE flex, Ext, IR up back x10 each  Shoulder Abd AROM x10, 1lb x10 Shoulder Flex 1lb 2x10  Shoulder ER AROM 2x10  Shoulder IR red 2x10  Rows & Ext red 2x10 RUE PROM with end  range holds   Grades 1-2 jt mobs  05/22/23  PROM/stretching R shoulder as appropriate  Shoulder flexion AAROM 1# rod x12  Chest presses 1# rod x12 Shoulder scaption AAROM 1# x15  Supine flexion AROM 0# x10  Sidelying ABD AROM 0# x10  Seated ER AROM towel pinched to side x15 available ROM   STM R upper trap, middle and anterior delt with good tissue release noted       05/15/23  PROM all directions surgical UE AAROM for flexion, supine with dowel x12  AAROM ER with dowel, seated x12   Isometrics flexion/ABD/extension/ER 12x3 seconds each      05/13/23 AAROM cane flex, abd, ext RUE PROM    Grade 1-2 Jt mobs RUE isometrics flex, ext, abt, IR, ER x2 each   PATIENT EDUCATION: Education details: exam findings, POC, HEP, really encouraged her to be protective of and cautious with surgical UE as it sounds like she has been using it much more than it is ready for given stage of healing  Person educated: Patient Education method: Explanation, Demonstration, Verbal cues, and Handouts Education comprehension: verbalized understanding, returned demonstration, and needs further education  HOME EXERCISE PROGRAM:  Access Code: [email or web address] URL: [email or web address] Date: 04/30/2023 Prepared by: Terrel Ferries  Exercises - Seated Scapular Retraction  - 3 x daily - 7 x weekly - 1 sets - 10 reps - Seated Backward Shoulder Rolls  - 3 x daily - 7 x weekly - 1 sets - 10 reps - Flexion-Extension Shoulder Pendulum with Table Support  - 3 x daily - 7 x weekly - 1 sets - 10-20 reps - Horizontal Shoulder Pendulum with Table Support  - 3 x daily - 7 x weekly - 1 sets - 10-20 reps - Wrist AROM Flexion Extension  - 3 x daily - 7 x weekly - 1 sets - 10 reps - Wrist Circumduction AROM  - 3 x daily - 7 x weekly - 1 sets - 10 reps - Seated Gripping Towel  - 3 x daily - 7 x weekly - 1 sets - 10 reps   ASSESSMENT:  CLINICAL IMPRESSION:  Pt arrived C/O stiffness and soreness in  the R shoulder. I worry that's she is doing too much at home. She stated "Im always doing something" Some improvement with RUE AROM but with soreness. Continues with some light AAROM. Tactile cue needed to  keep shoulder in place with ER/IR. Advised pt not to lift anything over 1lb at home  Patient is a 63 y.o. F who was seen today for physical therapy treatment for : RIGHT SHOULDER ARTHROSCOPY WITH ROTATOR CUFF REPAIR, subacromial decompression, distal clavicle excision, and Right Shouder open biceps tenodesis performed on 04/03/23. Pt enters without sling reporting the MD told her she did not have to wear it. Again encouraged her to not do quite as much around the house. Pt is 5 weeks out today initiated very light AAROM interventions, cueing pt to use LUE more.She has good overall ROM, ER being the most redistricted. Some end range pain with PROM. Will benefit from skilled PT services to guide post-op rehab and assist in return to optimal level of function.  OBJECTIVE IMPAIRMENTS: decreased ROM, decreased strength, hypomobility, increased edema, increased fascial restrictions, increased muscle spasms, impaired flexibility, impaired UE functional use, improper body mechanics, postural dysfunction, and pain.   ACTIVITY LIMITATIONS: carrying, lifting, sleeping, bed mobility, continence, bathing, toileting, dressing, reach over head, hygiene/grooming, and caring for others  PARTICIPATION LIMITATIONS: meal prep, cleaning, laundry, driving, shopping, community activity, and yard work  PERSONAL FACTORS: Behavior pattern, Education, Fitness, Past/current experiences, Social background, and Time since onset of injury/illness/exacerbation are also affecting patient's functional outcome.   REHAB POTENTIAL: Good  CLINICAL DECISION MAKING: Stable/uncomplicated  EVALUATION COMPLEXITY: Low   GOALS: Goals reviewed with patient? Yes  SHORT TERM GOALS: Target date: 06/11/2023    Will be compliant with  appropriate progressive HEP  Baseline: Goal status: Met 05/08/23  2.  Surgical shoulder PROM to be at least 150* flexion and scaption Baseline:  Goal status: 05/13/23 155 flex, 143 scaption Partly met, Met 06/14/23  3.  Surgical shoulder AROM to be at least 120* flexion and scaption  Baseline:  Goal status: Met 06/14/23  4.  Surgical shoulder IR and ER AROM to be at least 45* Baseline:  Goal status: Met 06/14/23  5.  Will demonstrate good awareness of postural mechanics with all functional tasks  Baseline:  Goal status: Partly Met 06/14/23    LONG TERM GOALS: Target date: 07/23/2023    MMT to be at least 4/5 surgical UE  Baseline:  Goal status: INITIAL  2.  Pain surgical UE to be no more than 2/10 with all functional tasks  Baseline:  Goal status: INITIAL  3.  Surgical shoulder AROM to be full all planes of motion  Baseline:  Goal status: Ongoing 06/21/23  4.  Will be able to perform all OH reaching and functional tasks related to self and home care without increase in pain  Baseline:  Goal status: INITIAL  5.  QuickDASH to have improved by at least 15 points to show improved subjective status  Baseline:  Goal status: 56.8/100= 56.8%    PLAN:  PT FREQUENCY: 2x/week  PT DURATION: 12 weeks  PLANNED INTERVENTIONS: 97110-Therapeutic exercises, 97530- Therapeutic activity, [id number]- Neuromuscular re-education, 97535- Self Care, and 16109- Manual therapy  PLAN FOR NEXT SESSION: Assess Goals next visit. Per protocol in context of rotator cuff repair with biceps tenodesis; 7 weeks as of 05/22/23,  MD wants us  to delay start of strengthening work until Monday 05/27/23   Towanda Fret, PTA 06/21/23 10:19 AM

## 2023-06-24 ENCOUNTER — Encounter: Payer: Self-pay | Admitting: Physical Therapy

## 2023-06-24 ENCOUNTER — Ambulatory Visit: Attending: Orthopaedic Surgery | Admitting: Physical Therapy

## 2023-06-24 DIAGNOSIS — R293 Abnormal posture: Secondary | ICD-10-CM

## 2023-06-24 DIAGNOSIS — M6281 Muscle weakness (generalized): Secondary | ICD-10-CM

## 2023-06-24 DIAGNOSIS — M25511 Pain in right shoulder: Secondary | ICD-10-CM

## 2023-06-24 DIAGNOSIS — M25611 Stiffness of right shoulder, not elsewhere classified: Secondary | ICD-10-CM | POA: Diagnosis not present

## 2023-06-24 NOTE — Therapy (Signed)
 OUTPATIENT PHYSICAL THERAPY SHOULDER TREATMENT    Patient Name: Andrea Huff MRN: [id number] DOB:11/06/1960, 63 y.o., female Today's Date: 06/24/2023  END OF SESSION:  PT End of Session - 06/24/23 1045     Visit Number 12    Number of Visits 25    Date for PT Re-Evaluation 07/23/23    Authorization Type [employer]    Authorization Time Period 04/30/23 to 07/23/23    PT Start Time 1022   pt late   PT Stop Time 1100    PT Time Calculation (min) 38 min    Activity Tolerance Patient tolerated treatment well    Behavior During Therapy [facility] for tasks assessed/performed                Past Medical History:  Diagnosis Date   Abnormal Pap smear    Age 68   Anxiety    Endometrial polyp 2007   Fibroid    GERD (gastroesophageal reflux disease)    H/O varicella    Hemorrhoids 10/18/06   History of measles, mumps, or rubella    Hypertension    IBS (irritable bowel syndrome)    Left lower quadrant pain 10/18/06   Nephropathy    Ovarian cyst 2010   Past Surgical History:  Procedure Laterality Date   CARDIOVASCULAR SURGERY     CHOLECYSTECTOMY     INNER EAR SURGERY     TOE AMPUTATION     TUBAL LIGATION     WISDOM TOOTH EXTRACTION     There are no active problems to display for this patient.   PCP: [name]   REFERRING PROVIDER: Amadeo Backers, MD  REFERRING DIAG: Diagnosis M75.111 (ICD-10-CM) - Incomplete rotator cuff tear or rupture of right shoulder, not specified as traumatic M19.011 (ICD-10-CM) - Primary osteoarthritis, right shoulder M75.21 (ICD-10-CM) - Bicipital tendinitis, right shoulder  THERAPY DIAG:  Stiffness of right shoulder, not elsewhere classified  Acute pain of right shoulder  Muscle weakness (generalized)  Abnormal posture  Rationale for Evaluation and Treatment: Rehabilitation  ONSET DATE: DOS: 04/03/23 Procedure: RIGHT SHOULDER ARTHROSCOPY WITH ROTATOR CUFF REPAIR, subacromial decompression, distal clavicle  excision, and Right Shouder open biceps tenodesis  SUBJECTIVE:                                                                                                                                                                                      SUBJECTIVE STATEMENT:  "Chicken wing" movement still hurts, shoulder is not feeling good, makes shoulder heal in the front. Shoulder is better today, it was really hurting last visit. Shoulder gets irritated with movement, cooking, driving, shopping.  EVAL: Shoulder hurts all the time. Saw the PA from the surgeon and he said I can come out of the sling already but OK to wear if it hurts. I am right handed and I need to do a lot with that arm.  Not even pain meds help. Usually ina power chair because if I weight bear I get abscesses on the bottom of my feet, this has been for the past 10 years.   Hand dominance: Right  PERTINENT HISTORY: See above   PAIN:  Are you having pain? No   PRECAUTIONS: Other: per rotator cuff repair with tenodesis protocol  RED FLAGS: None   WEIGHT BEARING RESTRICTIONS: No "the pain is OK right now as long as we're not using it, if we use it, it will get irritated"   FALLS:  Has patient fallen in last 6 months? Yes. Number of falls 1; face first fall on drive way, no injuries and no FOF   LIVING ENVIRONMENT: Lives with: friend who helps  Lives in: House/apartment Stairs: No has basement but doesn't have to use it  Has following equipment at home: Single point cane, Wheelchair (power), and Ramped entry  OCCUPATION: Disability   PLOF: Independent, Independent with basic ADLs, Independent with gait, and Independent with transfers  PATIENT GOALS:get shoulder feeling better, be able to use it again   NEXT MD VISIT: "coming up here soon,   OBJECTIVE:  Note: Objective measures were completed at Evaluation unless otherwise noted.    PATIENT SURVEYS:  Quick Dash 72.7%  COGNITION: Overall cognitive status:  Within functional limits for tasks assessed     SENSATION: Not tested  POSTURE: Rounded shoulders, forward head, thoracic kyphosis   UPPER EXTREMITY ROM:   Passive ROM Right eval Right 05/15/23 Right AROM 06/14/23 Right AROM 06/21/23  Shoulder flexion 90* limited by pain  140* before pain started  150 153  Shoulder extension      Shoulder scaption 100* limited by pain  120* scaption     Shoulder adduction   139 140  Shoulder internal rotation Hand to belly at 0* ABD  52   Shoulder external rotation 20* at zero degrees ABD 15-20* at zero degrees ABD  73   Elbow flexion      Elbow extension      Wrist flexion      Wrist extension      Wrist ulnar deviation      Wrist radial deviation      Wrist pronation      Wrist supination      (Blank rows = not tested)  UPPER EXTREMITY MMT:  MMT Right 06/24/23 Left 06/24/23  Shoulder flexion 3- 4-  Shoulder extension    Shoulder abduction 3- 4  Shoulder adduction    Shoulder internal rotation 3- 4+  Shoulder external rotation 3- 4-  Middle trapezius    Lower trapezius    Elbow flexion    Elbow extension    Wrist flexion    Wrist extension    Wrist ulnar deviation    Wrist radial deviation    Wrist pronation    Wrist supination    Grip strength (lbs)    (Blank rows = not tested)  MMT not tested at eval due to surgical precautions  TREATMENT DATE:   06/24/23  MMT, education on progress with PT, POC moving forward and review of goals, HEP updates and need to adjust schedule in context of insurance auth/visits remaining, healing time for this surgery  PROM all directions to tolerance prior to exercise per pt request  Supine shoulder flexion 1# x10 Supine serratus punches 1# x10 Sidelying shoulder ABD 0# x10 Supine serratus punch + CW/CCW circles x10 each way   UBE L2 x3 min forward/3 min backwards  for shoulder mm endurance    06/21/23 UBE L1 x3 min each Rows & Lats 15lb 2x10 RUE ER & IR yellow 2x10 Shoulder Flex & abd 2x10 Rows red 2x10  06/19/23 UBE L2 x 3 min each AAROM 1lb WaTE flex, Ext, IR x10 each  Shoulder Flex & abd 1lb 2x10 RUE ER yellow 2x10 RUE PROM with end range holds   Grades 1-2 jt mobs      PATIENT EDUCATION: Education details: exam findings, POC, HEP, really encouraged her to be protective of and cautious with surgical UE as it sounds like she has been using it much more than it is ready for given stage of healing  Person educated: Patient Education method: Explanation, Demonstration, Verbal cues, and Handouts Education comprehension: verbalized understanding, returned demonstration, and needs further education  HOME EXERCISE PROGRAM:  Access Code: [email or web address] URL: [email or web address] Date: 06/24/2023 Prepared by: Terrel Ferries  Exercises - Seated Scapular Retraction  - 3 x daily - 7 x weekly - 1 sets - 10 reps - Seated Backward Shoulder Rolls  - 3 x daily - 7 x weekly - 1 sets - 10 reps - Flexion-Extension Shoulder Pendulum with Table Support  - 3 x daily - 7 x weekly - 1 sets - 10-20 reps - Horizontal Shoulder Pendulum with Table Support  - 3 x daily - 7 x weekly - 1 sets - 10-20 reps - Wrist AROM Flexion Extension  - 3 x daily - 7 x weekly - 1 sets - 10 reps - Wrist Circumduction AROM  - 3 x daily - 7 x weekly - 1 sets - 10 reps - Seated Gripping Towel  - 3 x daily - 7 x weekly - 1 sets - 10 reps - Supine Shoulder Flexion with Free Weight  - 1 x daily - 4-5 x weekly - 1-2 sets - 10 reps - Single Arm Serratus Punches in Supine with Dumbbell  - 1 x daily - 4-5 x weekly - 1-2 sets - 10 reps - Sidelying Shoulder Abduction Full Range of Motion with Dumbbell  - 1 x daily - 4-5 x weekly - 1-2 sets - 10 reps - Supine Shoulder Circles with Weight  - 1 x daily - 4-5 x weekly - 1-2 sets - 10 reps   ASSESSMENT:  CLINICAL  IMPRESSION:   Updated MMT, LTGs and HEP today. Spent ongoing time on shoulder ROM as well as strengthening as appropriate especially as she is now almost 12 weeks out from her surgery. Sees the MD on June 9th. I am still concerned that she is doing a bit too much for her shoulder especially given considerable weakness noted with MMT today, which is likely driving up pain levels.      Patient is a 63 y.o. F who was seen today for physical therapy treatment for : RIGHT SHOULDER ARTHROSCOPY WITH ROTATOR CUFF REPAIR, subacromial decompression, distal clavicle excision, and Right Shouder open biceps tenodesis performed on 04/03/23. Pt enters without sling reporting the MD told her she  did not have to wear it. Again encouraged her to not do quite as much around the house. Pt is 5 weeks out today initiated very light AAROM interventions, cueing pt to use LUE more.She has good overall ROM, ER being the most redistricted. Some end range pain with PROM. Will benefit from skilled PT services to guide post-op rehab and assist in return to optimal level of function.  OBJECTIVE IMPAIRMENTS: decreased ROM, decreased strength, hypomobility, increased edema, increased fascial restrictions, increased muscle spasms, impaired flexibility, impaired UE functional use, improper body mechanics, postural dysfunction, and pain.   ACTIVITY LIMITATIONS: carrying, lifting, sleeping, bed mobility, continence, bathing, toileting, dressing, reach over head, hygiene/grooming, and caring for others  PARTICIPATION LIMITATIONS: meal prep, cleaning, laundry, driving, shopping, community activity, and yard work  PERSONAL FACTORS: Behavior pattern, Education, Fitness, Past/current experiences, Social background, and Time since onset of injury/illness/exacerbation are also affecting patient's functional outcome.   REHAB POTENTIAL: Good  CLINICAL DECISION MAKING: Stable/uncomplicated  EVALUATION COMPLEXITY: Low   GOALS: Goals  reviewed with patient? Yes  SHORT TERM GOALS: Target date: 06/11/2023    Will be compliant with appropriate progressive HEP  Baseline: Goal status: Met 05/08/23  2.  Surgical shoulder PROM to be at least 150* flexion and scaption Baseline:  Goal status: 05/13/23 155 flex, 143 scaption Partly met, Met 06/14/23  3.  Surgical shoulder AROM to be at least 120* flexion and scaption  Baseline:  Goal status: Met 06/14/23  4.  Surgical shoulder IR and ER AROM to be at least 45* Baseline:  Goal status: Met 06/14/23  5.  Will demonstrate good awareness of postural mechanics with all functional tasks  Baseline:  Goal status: Partly Met 06/14/23    LONG TERM GOALS: Target date: 07/23/2023    MMT to be at least 4/5 surgical UE  Baseline:  Goal status: ONGOING 06/24/23  2.  Pain surgical UE to be no more than 2/10 with all functional tasks  Baseline:  Goal status: ONGOING 06/24/23  3.  Surgical shoulder AROM to be full all planes of motion  Baseline:  Goal status: Ongoing 06/21/23  4.  Will be able to perform all OH reaching and functional tasks related to self and home care without increase in pain  Baseline:  Goal status: ONGOING 06/24/23  5.  QuickDASH to have improved by at least 15 points to show improved subjective status  Baseline:  Goal status: ONGOING 06/24/23    PLAN:  PT FREQUENCY: 2x/week  PT DURATION: 12 weeks  PLANNED INTERVENTIONS: 97110-Therapeutic exercises, 97530- Therapeutic activity, 97112- Neuromuscular re-education, 97535- Self Care, and 19147- Manual therapy  PLAN FOR NEXT SESSION: Per protocol in context of rotator cuff repair with biceps tenodesis; 11.5 weeks as of 06/24/23. HEP updates PRN  Terrel Ferries, PT, DPT 06/24/23 11:04 AM

## 2023-06-26 ENCOUNTER — Ambulatory Visit: Admitting: Physical Therapy

## 2023-07-03 ENCOUNTER — Ambulatory Visit: Admitting: Physical Therapy

## 2023-07-08 ENCOUNTER — Ambulatory Visit: Admitting: Physical Therapy

## 2023-07-10 ENCOUNTER — Ambulatory Visit: Admitting: Physical Therapy
